# Patient Record
Sex: Female | Born: 1960 | Hispanic: No | Smoking: Never smoker
Health system: Southern US, Community
[De-identification: ages and names within clinical notes are randomized; demographics above are authoritative.]

## PROBLEM LIST (undated history)

## (undated) DIAGNOSIS — I499 Cardiac arrhythmia, unspecified: Secondary | ICD-10-CM

## (undated) DIAGNOSIS — F32A Depression, unspecified: Secondary | ICD-10-CM

## (undated) DIAGNOSIS — J45909 Unspecified asthma, uncomplicated: Secondary | ICD-10-CM

## (undated) DIAGNOSIS — F329 Major depressive disorder, single episode, unspecified: Secondary | ICD-10-CM

## (undated) DIAGNOSIS — E119 Type 2 diabetes mellitus without complications: Secondary | ICD-10-CM

## (undated) DIAGNOSIS — G8929 Other chronic pain: Secondary | ICD-10-CM

## (undated) DIAGNOSIS — I10 Essential (primary) hypertension: Secondary | ICD-10-CM

## (undated) DIAGNOSIS — D573 Sickle-cell trait: Secondary | ICD-10-CM

## (undated) DIAGNOSIS — E7800 Pure hypercholesterolemia, unspecified: Secondary | ICD-10-CM

## (undated) DIAGNOSIS — M797 Fibromyalgia: Secondary | ICD-10-CM

## (undated) DIAGNOSIS — M1990 Unspecified osteoarthritis, unspecified site: Secondary | ICD-10-CM

## (undated) DIAGNOSIS — R519 Headache, unspecified: Secondary | ICD-10-CM

## (undated) DIAGNOSIS — M549 Dorsalgia, unspecified: Secondary | ICD-10-CM

## (undated) DIAGNOSIS — R51 Headache: Secondary | ICD-10-CM

## (undated) DIAGNOSIS — T7840XA Allergy, unspecified, initial encounter: Secondary | ICD-10-CM

## (undated) DIAGNOSIS — K219 Gastro-esophageal reflux disease without esophagitis: Secondary | ICD-10-CM

## (undated) HISTORY — PX: KNEE ARTHROSCOPY: SHX127

## (undated) HISTORY — DX: Unspecified osteoarthritis, unspecified site: M19.90

## (undated) HISTORY — PX: ABDOMINAL HYSTERECTOMY: SHX81

## (undated) HISTORY — PX: EXPLORATORY LAPAROTOMY: SUR591

## (undated) HISTORY — PX: BACK SURGERY: SHX140

---

## 1999-09-09 ENCOUNTER — Other Ambulatory Visit: Admission: RE | Admit: 1999-09-09 | Discharge: 1999-09-09 | Payer: Self-pay | Admitting: Family Medicine

## 2004-06-29 ENCOUNTER — Ambulatory Visit: Payer: Self-pay | Admitting: Family Medicine

## 2004-08-06 ENCOUNTER — Ambulatory Visit: Payer: Self-pay | Admitting: Family Medicine

## 2004-08-11 ENCOUNTER — Ambulatory Visit: Payer: Self-pay | Admitting: Family Medicine

## 2004-09-22 ENCOUNTER — Ambulatory Visit: Payer: Self-pay | Admitting: Family Medicine

## 2005-04-05 ENCOUNTER — Ambulatory Visit: Payer: Self-pay | Admitting: Family Medicine

## 2015-06-16 ENCOUNTER — Other Ambulatory Visit: Payer: Self-pay

## 2015-06-16 MED ORDER — ESOMEPRAZOLE MAGNESIUM 40 MG PO CPDR: 40.0000 mg | DELAYED_RELEASE_CAPSULE | Freq: Every day | ORAL | Status: DC

## 2015-07-15 ENCOUNTER — Other Ambulatory Visit: Payer: Self-pay | Admitting: *Deleted

## 2015-07-15 MED ORDER — MONTELUKAST SODIUM 10 MG PO TABS: 10.0000 mg | ORAL_TABLET | Freq: Every day | ORAL | Status: AC

## 2015-07-15 MED ORDER — RANITIDINE HCL 300 MG PO TABS: 300.0000 mg | ORAL_TABLET | Freq: Every day | ORAL | Status: DC

## 2015-07-15 MED ORDER — LORATADINE 10 MG PO TABS: 10.0000 mg | ORAL_TABLET | Freq: Every day | ORAL | Status: AC | PRN

## 2015-07-26 HISTORY — PX: COLONOSCOPY: SHX174

## 2015-07-30 ENCOUNTER — Other Ambulatory Visit: Payer: Self-pay

## 2015-08-07 ENCOUNTER — Other Ambulatory Visit: Payer: Self-pay | Admitting: *Deleted

## 2015-08-07 ENCOUNTER — Telehealth: Payer: Self-pay | Admitting: *Deleted

## 2015-08-07 MED ORDER — ESOMEPRAZOLE MAGNESIUM 40 MG PO CPDR: DELAYED_RELEASE_CAPSULE | ORAL | Status: AC

## 2015-08-07 NOTE — Telephone Encounter (Signed)
PT CALLED REQUESTING A MEDICATION REFILL FOR NEXIUM. PT HAD PHARMACY SEND US A REFILL REQUEST ON THE 3RD AND STILL HAS NOT HEARD ANYTHING. PHARMACY IS PREVO.

## 2015-08-07 NOTE — Telephone Encounter (Signed)
ONE REFILL SENT - WILL HAVE TO HAVE AN OV BEFORE WE CAN FILL AGAIN.

## 2015-08-17 ENCOUNTER — Other Ambulatory Visit: Payer: Self-pay

## 2015-11-16 ENCOUNTER — Other Ambulatory Visit: Payer: Self-pay | Admitting: Orthopedic Surgery

## 2015-11-16 DIAGNOSIS — M4317 Spondylolisthesis, lumbosacral region: Secondary | ICD-10-CM

## 2015-11-18 ENCOUNTER — Ambulatory Visit
Admission: RE | Admit: 2015-11-18 | Discharge: 2015-11-18 | Disposition: A | Discharge status: Home / Self Care | Payer: Medicare Other | Source: Ambulatory Visit | Attending: Orthopedic Surgery | Admitting: Orthopedic Surgery

## 2015-11-18 DIAGNOSIS — M4317 Spondylolisthesis, lumbosacral region: Secondary | ICD-10-CM

## 2015-12-14 ENCOUNTER — Ambulatory Visit: Payer: Self-pay | Admitting: Physician Assistant

## 2016-02-01 NOTE — Pre-Procedure Instructions (Signed)
Karen KitchensCheryl E Bade  02/01/2016     Your procedure is scheduled on July 19.  [facility] Ad[facility] at 6:30 A.M.  Call this number if you have problems the morning of surgery:  [phone]   Remember:  Do not eat food or drink liquids after midnight.  Take these medicines the morning of surgery with A SIP OF WATER Albuterol (if needed), Nexium, Loratadine (if needed), Singulair, Oxycodone (if needed)   STOP/ Do not take Aspirin, Aleve, Naproxen, Advil, Ibuprofen, Motrin, Vitamins, Herbs, or Supplements starting today    How to Manage Your Diabetes Before and After Surgery  Why is it important to control my blood sugar before and after surgery? . Improving blood sugar levels before and after surgery helps healing and can limit problems. . A way of improving blood sugar control is eating a healthy diet by: o  Eating less sugar and carbohydrates o  Increasing activity/exercise o  Talking with your doctor about reaching your blood sugar goals . High blood sugars (greater than 180 mg/dL) can raise your risk of infections and slow your recovery, so you will need to focus on controlling your diabetes during the weeks before surgery. . Make sure that the doctor who takes care of your diabetes knows about your planned surgery including the date and location.  How do I manage my blood sugar before surgery? . Check your blood sugar at least 4 times a day, starting 2 days before surgery, to make sure that the level is not too high or low. o Check your blood sugar the morning of your surgery when you wake up and every 2 hours until you get to the Short Stay unit. . If your blood sugar is less than 70 mg/dL, you will need to treat for low blood sugar: o Do not take insulin. o Treat a low blood sugar (less than 70 mg/dL) with  cup of clear juice (cranberry or apple), 4 glucose tablets, OR glucose gel. o Recheck blood sugar in 15 minutes after treatment (to make sure it is  greater than 70 mg/dL). If your blood sugar is not greater than 70 mg/dL on recheck, call [phone] for further instructions. . Report your blood sugar to the short stay nurse when you get to Short Stay.  . If you are admitted to the hospital after surgery: o Your blood sugar will be checked by the staff and you will probably be given insulin after surgery (instead of oral diabetes medicines) to make sure you have good blood sugar levels. o The goal for blood sugar control after surgery is 80-180 mg/dL.   WHAT DO I DO ABOUT MY DIABETES MEDICATION?   Marland Kitchen. Do not take oral diabetes medicines (pills) the morning of surgery.  . The day of surgery, do not take other diabetes injectables, including Byetta (exenatide), Bydureon (exenatide ER), Victoza (liraglutide), or Trulicity (dulaglutide).   Do not wear jewelry, make-up or nail polish.  Do not wear lotions, powders, or perfumes.  You may wear deoderant.  Do not shave 48 hours prior to surgery.  Men may shave face and neck.  Do not bring valuables to the hospital.  [facility] is not responsible for any belongings or valuables.  Contacts, dentures or bridgework may not be worn into surgery.  Leave your suitcase in the car.  After surgery it may be brought to your room.  For patients admitted to the hospital, discharge time will be determined by your treatment  team.  Patients discharged the day of surgery will not be allowed to drive home.   [facility] - Preparing for Surgery  Before surgery, you can play an important role.  Because skin is not sterile, your skin needs to be as free of germs as possible.  You can reduce the number of germs on you skin by washing with CHG (chlorahexidine gluconate) soap before surgery.  CHG is an antiseptic cleaner which kills germs and bonds with the skin to continue killing germs even after washing.  Please DO NOT use if you have an allergy to CHG or antibacterial soaps.  If your skin becomes  reddened/irritated stop using the CHG and inform your nurse when you arrive at Short Stay.  Do not shave (including legs and underarms) for at least 48 hours prior to the first CHG shower.  You may shave your face.  Please follow these instructions carefully:   1.  Shower with CHG Soap the night before surgery and the morning of Surgery.  2.  If you choose to wash your hair, wash your hair first as usual with your normal shampoo.  3.  After you shampoo, rinse your hair and body thoroughly to remove the shampoo.  4.  Use CHG as you would any other liquid soap.  You can apply CHG directly to the skin and wash gently with scrungie or a clean washcloth.  5.  Apply the CHG Soap to your body ONLY FROM THE NECK DOWN.  Do not use on open wounds or open sores.  Avoid contact with your eyes, ears, mouth and genitals (private parts).  Wash genitals (private parts) with your normal soap.  6.  Wash thoroughly, paying special attention to the area where your surgery will be performed.  7.  Thoroughly rinse your body with warm water from the neck down.  8.  DO NOT shower/wash with your normal soap after using and rinsing off the CHG Soap.  9.  Pat yourself dry with a clean towel.            10.  Wear clean pajamas.            11.  Place clean sheets on your bed the night of your first shower and do not sleep with pets.  Day of Surgery  Do not apply any lotions the morning of surgery.  Please wear clean clothes to the hospital/surgery center.

## 2016-02-02 ENCOUNTER — Encounter (HOSPITAL_COMMUNITY)
Admission: RE | Admit: 2016-02-02 | Discharge: 2016-02-02 | Disposition: A | Discharge status: Home / Self Care | Payer: Medicare Other | Source: Ambulatory Visit | Attending: Orthopedic Surgery | Admitting: Orthopedic Surgery

## 2016-02-02 ENCOUNTER — Encounter (HOSPITAL_COMMUNITY): Payer: Self-pay

## 2016-02-02 DIAGNOSIS — I10 Essential (primary) hypertension: Secondary | ICD-10-CM | POA: Diagnosis not present

## 2016-02-02 DIAGNOSIS — Z01810 Encounter for preprocedural cardiovascular examination: Secondary | ICD-10-CM | POA: Insufficient documentation

## 2016-02-02 DIAGNOSIS — Z01812 Encounter for preprocedural laboratory examination: Secondary | ICD-10-CM | POA: Diagnosis not present

## 2016-02-02 HISTORY — DX: Headache, unspecified: R51.9

## 2016-02-02 HISTORY — DX: Headache: R51

## 2016-02-02 HISTORY — DX: Pure hypercholesterolemia, unspecified: E78.00

## 2016-02-02 HISTORY — DX: Fibromyalgia: M79.7

## 2016-02-02 HISTORY — DX: Allergy, unspecified, initial encounter: T78.40XA

## 2016-02-02 HISTORY — DX: Unspecified asthma, uncomplicated: J45.909

## 2016-02-02 HISTORY — DX: Other chronic pain: G89.29

## 2016-02-02 HISTORY — DX: Type 2 diabetes mellitus without complications: E11.9

## 2016-02-02 HISTORY — DX: Dorsalgia, unspecified: M54.9

## 2016-02-02 HISTORY — DX: Depression, unspecified: F32.A

## 2016-02-02 HISTORY — DX: Cardiac arrhythmia, unspecified: I49.9

## 2016-02-02 HISTORY — DX: Essential (primary) hypertension: I10

## 2016-02-02 HISTORY — DX: Sickle-cell trait: D57.3

## 2016-02-02 HISTORY — DX: Gastro-esophageal reflux disease without esophagitis: K21.9

## 2016-02-02 HISTORY — DX: Major depressive disorder, single episode, unspecified: F32.9

## 2016-02-02 LAB — BASIC METABOLIC PANEL
Anion gap: 9 (ref 5–15)
BUN: 12 mg/dL (ref 6–20)
CO2: 28 mmol/L (ref 22–32)
Calcium: 9.4 mg/dL (ref 8.9–10.3)
Chloride: 101 mmol/L (ref 101–111)
Creatinine, Ser: 0.96 mg/dL (ref 0.44–1.00)
GFR calc Af Amer: >60 mL/min (ref >60)
GFR calc non Af Amer: >60 mL/min (ref >60)
Glucose, Bld: 104 mg/dL — ABNORMAL HIGH (ref 65–99)
Potassium: 3.4 mmol/L — ABNORMAL LOW (ref 3.5–5.1)
Sodium: 138 mmol/L (ref 135–145)

## 2016-02-02 LAB — ABO/RH: ABO/RH(D): O POS

## 2016-02-02 LAB — CBC
HCT: 39.4 % (ref 36.0–46.0)
Hemoglobin: 13.1 g/dL (ref 12.0–15.0)
MCH: 26.3 pg (ref 26.0–34.0)
MCHC: 33.2 g/dL (ref 30.0–36.0)
MCV: 79.1 fL (ref 78.0–100.0)
Platelets: 232 10*3/uL (ref 150–400)
RBC: 4.98 MIL/uL (ref 3.87–5.11)
RDW: 13.9 % (ref 11.5–15.5)
WBC: 5.5 10*3/uL (ref 4.0–10.5)

## 2016-02-02 LAB — TYPE AND SCREEN
ABO/RH(D): O POS
Antibody Screen: NEGATIVE

## 2016-02-02 LAB — SURGICAL PCR SCREEN
MRSA, PCR: NEGATIVE
Staphylococcus aureus: NEGATIVE

## 2016-02-02 LAB — GLUCOSE, CAPILLARY: Glucose-Capillary: 97 mg/dL (ref 65–99)

## 2016-02-02 NOTE — Progress Notes (Addendum)
PCP - Dina Richobert Dough - requested records - [phone] Cardiologist - Dr. Gypsy BalsamKrasowski, robert - requested records [phone] Allergy Doctor - Dr. Sharyn LullKoslow [phone]  Chest x-ray - denies EKG - several years ago - requested - completed one today 02/02/16 Stress Test - several years ago - requested ECHO - several years ago - requested Cardiac Cath - denies  Patient had sleep study completed several years ago, was not diagnosed with Sleep apnea - requested records [phone]  Patient has several allergies     Patient denies shortness of breath, fever, cough and chest pain at PAT appointment   Will need A1C DOS

## 2016-02-03 LAB — HEMOGLOBIN A1C
Hgb A1c MFr Bld: 5.8 % — ABNORMAL HIGH (ref 4.8–5.6)
Mean Plasma Glucose: 120 mg/dL

## 2016-02-03 NOTE — Progress Notes (Signed)
Anesthesia Chart Review:  Pt is a 55 year old female scheduled for L5-S1 TLIF on 02/10/2016 with Venita Lickahari Brooks, MD.   PMH includes:  HTN, DM, hyperlipidemia, asthma, GERD. BMI 41  Pt will multiple allergies.   Medications include: albuterol, lipitor, nexium, irbesartan-hctz, zantac, victoza, welchol  Preoperative labs reviewed.  HgbA1c 5.8, glucose 104  EKG 02/02/16: NSR. Minimal voltage criteria for LVH, may be normal variant  Cardiac event monitor 05/23/11:  1. Normal event monitor (worn 9/20-10/18/12) 2. No arrhythmias noted 3. No symptoms reported  Nuclear stress test 04/20/11:  - no evidence ischemia - normal wall motion and thickening. EF 60% - Good exercise capacity  Echo 02/27/06:  1. Very mild MR with mild LA enlargement 2. LV size, wall thickness and systolic function are normal.   Pt saw Gypsy Balsamobert Krasowski, MD with cardiology in 2012 for palpitations. His notes indicate pt wore a Holter monitor in 2009 that showed 1 episode of SVT and pt was placed on digoxin. Dr. Bing MatterKrasowski ordered a cardiac event monitor and stress test (see above). It appears that pt was taken off of digoxin with normal test results and no longer follows with cardiology.   If no changes, I anticipate pt can proceed with surgery as scheduled.   Rica Mastngela Maurisio Ruddy, FNP-BC [facility]/Anesthesiology Phone: [phone] 02/03/2016 12:25 PM

## 2016-02-10 ENCOUNTER — Inpatient Hospital Stay (HOSPITAL_COMMUNITY)
Admission: RE | Admit: 2016-02-10 | Discharge: 2016-02-12 | DRG: 460 | Disposition: A | Discharge status: Home / Self Care | Payer: Medicare Other | Source: Ambulatory Visit | Attending: Orthopedic Surgery | Admitting: Orthopedic Surgery

## 2016-02-10 ENCOUNTER — Encounter (HOSPITAL_COMMUNITY)
Admission: RE | Disposition: A | Discharge status: Home / Self Care | Payer: Self-pay | Source: Ambulatory Visit | Attending: Orthopedic Surgery

## 2016-02-10 ENCOUNTER — Inpatient Hospital Stay (HOSPITAL_COMMUNITY): Payer: Medicare Other | Admitting: Emergency Medicine

## 2016-02-10 ENCOUNTER — Inpatient Hospital Stay (HOSPITAL_COMMUNITY): Payer: Medicare Other | Admitting: Certified Registered"

## 2016-02-10 ENCOUNTER — Inpatient Hospital Stay (HOSPITAL_COMMUNITY): Payer: Medicare Other

## 2016-02-10 ENCOUNTER — Encounter (HOSPITAL_COMMUNITY): Payer: Self-pay | Admitting: *Deleted

## 2016-02-10 DIAGNOSIS — E119 Type 2 diabetes mellitus without complications: Secondary | ICD-10-CM | POA: Diagnosis present

## 2016-02-10 DIAGNOSIS — M797 Fibromyalgia: Secondary | ICD-10-CM | POA: Diagnosis present

## 2016-02-10 DIAGNOSIS — M5116 Intervertebral disc disorders with radiculopathy, lumbar region: Principal | ICD-10-CM | POA: Diagnosis present

## 2016-02-10 DIAGNOSIS — Z419 Encounter for procedure for purposes other than remedying health state, unspecified: Secondary | ICD-10-CM

## 2016-02-10 DIAGNOSIS — K219 Gastro-esophageal reflux disease without esophagitis: Secondary | ICD-10-CM | POA: Diagnosis present

## 2016-02-10 DIAGNOSIS — I10 Essential (primary) hypertension: Secondary | ICD-10-CM | POA: Diagnosis present

## 2016-02-10 DIAGNOSIS — J45909 Unspecified asthma, uncomplicated: Secondary | ICD-10-CM | POA: Diagnosis present

## 2016-02-10 DIAGNOSIS — F329 Major depressive disorder, single episode, unspecified: Secondary | ICD-10-CM | POA: Diagnosis present

## 2016-02-10 DIAGNOSIS — Z7984 Long term (current) use of oral hypoglycemic drugs: Secondary | ICD-10-CM

## 2016-02-10 DIAGNOSIS — M4806 Spinal stenosis, lumbar region: Secondary | ICD-10-CM | POA: Diagnosis present

## 2016-02-10 DIAGNOSIS — E7800 Pure hypercholesterolemia, unspecified: Secondary | ICD-10-CM | POA: Diagnosis present

## 2016-02-10 DIAGNOSIS — Z833 Family history of diabetes mellitus: Secondary | ICD-10-CM | POA: Diagnosis not present

## 2016-02-10 DIAGNOSIS — Z6839 Body mass index (BMI) 39.0-39.9, adult: Secondary | ICD-10-CM | POA: Diagnosis not present

## 2016-02-10 DIAGNOSIS — M4306 Spondylolysis, lumbar region: Secondary | ICD-10-CM | POA: Diagnosis present

## 2016-02-10 DIAGNOSIS — Z818 Family history of other mental and behavioral disorders: Secondary | ICD-10-CM | POA: Diagnosis not present

## 2016-02-10 DIAGNOSIS — M549 Dorsalgia, unspecified: Secondary | ICD-10-CM | POA: Diagnosis present

## 2016-02-10 DIAGNOSIS — Z981 Arthrodesis status: Secondary | ICD-10-CM

## 2016-02-10 LAB — GLUCOSE, CAPILLARY
Glucose-Capillary: 112 mg/dL — ABNORMAL HIGH (ref 65–99)
Glucose-Capillary: 116 mg/dL — ABNORMAL HIGH (ref 65–99)
Glucose-Capillary: 151 mg/dL — ABNORMAL HIGH (ref 65–99)
Glucose-Capillary: 205 mg/dL — ABNORMAL HIGH (ref 65–99)

## 2016-02-10 SURGERY — POSTERIOR LUMBAR FUSION 1 LEVEL
Anesthesia: General | Site: Back

## 2016-02-10 MED ORDER — ARTIFICIAL TEARS OP OINT
TOPICAL_OINTMENT | OPHTHALMIC | Status: AC
  Filled 2016-02-10: qty 3.5

## 2016-02-10 MED ORDER — MORPHINE SULFATE (PF) 2 MG/ML IV SOLN
1.0000 mg | INTRAVENOUS | Status: DC | PRN
  Administered 2016-02-10 (×2): 2 mg via INTRAVENOUS
  Filled 2016-02-10 (×2): qty 1

## 2016-02-10 MED ORDER — SODIUM CHLORIDE 0.9% FLUSH
3.0000 mL | Freq: Two times a day (BID) | INTRAVENOUS | Status: DC
  Administered 2016-02-10 (×2): 3 mL via INTRAVENOUS

## 2016-02-10 MED ORDER — METHOCARBAMOL 500 MG PO TABS
500.0000 mg | ORAL_TABLET | Freq: Four times a day (QID) | ORAL | Status: DC | PRN
  Administered 2016-02-10 – 2016-02-12 (×7): 500 mg via ORAL
  Filled 2016-02-10 (×6): qty 1

## 2016-02-10 MED ORDER — DEXAMETHASONE SODIUM PHOSPHATE 10 MG/ML IJ SOLN
INTRAMUSCULAR | Status: AC
  Filled 2016-02-10: qty 1

## 2016-02-10 MED ORDER — PHENYLEPHRINE HCL 10 MG/ML IJ SOLN
INTRAMUSCULAR | Status: DC | PRN
  Administered 2016-02-10: 40 ug via INTRAVENOUS
  Administered 2016-02-10 (×4): 80 ug via INTRAVENOUS

## 2016-02-10 MED ORDER — ALBUTEROL SULFATE (2.5 MG/3ML) 0.083% IN NEBU: 3.0000 mL | INHALATION_SOLUTION | RESPIRATORY_TRACT | Status: DC | PRN

## 2016-02-10 MED ORDER — OXYCODONE HCL 5 MG/5ML PO SOLN: 5.0000 mg | Freq: Once | ORAL | Status: AC | PRN

## 2016-02-10 MED ORDER — LIDOCAINE HCL (CARDIAC) 20 MG/ML IV SOLN
INTRAVENOUS | Status: DC | PRN
  Administered 2016-02-10: 100 mg via INTRAVENOUS

## 2016-02-10 MED ORDER — DEXAMETHASONE SODIUM PHOSPHATE 10 MG/ML IJ SOLN
INTRAMUSCULAR | Status: DC | PRN
  Administered 2016-02-10: 10 mg via INTRAVENOUS

## 2016-02-10 MED ORDER — OXYCODONE HCL 5 MG PO TABS
5.0000 mg | ORAL_TABLET | Freq: Once | ORAL | Status: AC | PRN
  Administered 2016-02-10: 5 mg via ORAL

## 2016-02-10 MED ORDER — MENTHOL 3 MG MT LOZG
1.0000 | LOZENGE | OROMUCOSAL | Status: DC | PRN
Start: 2016-02-10 — End: 2016-02-12

## 2016-02-10 MED ORDER — PROPOFOL 10 MG/ML IV BOLUS
INTRAVENOUS | Status: DC | PRN
  Administered 2016-02-10: 200 mg via INTRAVENOUS

## 2016-02-10 MED ORDER — SODIUM CHLORIDE 0.9% FLUSH: 3.0000 mL | INTRAVENOUS | Status: DC | PRN

## 2016-02-10 MED ORDER — OXYCODONE HCL 5 MG PO TABS
10.0000 mg | ORAL_TABLET | ORAL | Status: DC | PRN
  Administered 2016-02-10 – 2016-02-12 (×11): 10 mg via ORAL
  Filled 2016-02-10 (×10): qty 2

## 2016-02-10 MED ORDER — ACETAMINOPHEN 10 MG/ML IV SOLN
1000.0000 mg | Freq: Once | INTRAVENOUS | Status: AC
  Administered 2016-02-10: 1000 mg via INTRAVENOUS
  Filled 2016-02-10: qty 100

## 2016-02-10 MED ORDER — LACTATED RINGERS IV SOLN: INTRAVENOUS | Status: DC

## 2016-02-10 MED ORDER — LIDOCAINE 2% (20 MG/ML) 5 ML SYRINGE
INTRAMUSCULAR | Status: AC
  Filled 2016-02-10: qty 5

## 2016-02-10 MED ORDER — MIDAZOLAM HCL 5 MG/5ML IJ SOLN
INTRAMUSCULAR | Status: DC | PRN
  Administered 2016-02-10: 2 mg via INTRAVENOUS

## 2016-02-10 MED ORDER — CEFAZOLIN SODIUM-DEXTROSE 2-4 GM/100ML-% IV SOLN
2.0000 g | INTRAVENOUS | Status: AC
  Administered 2016-02-10: 2 g via INTRAVENOUS
  Filled 2016-02-10: qty 100

## 2016-02-10 MED ORDER — LACTATED RINGERS IV SOLN
INTRAVENOUS | Status: DC | PRN
Start: 2016-02-10 — End: 2016-02-10
  Administered 2016-02-10: 09:00:00 via INTRAVENOUS

## 2016-02-10 MED ORDER — METHOCARBAMOL 1000 MG/10ML IJ SOLN
500.0000 mg | Freq: Four times a day (QID) | INTRAVENOUS | Status: DC | PRN
  Filled 2016-02-10: qty 5

## 2016-02-10 MED ORDER — EPINEPHRINE 0.3 MG/0.3ML IJ SOAJ: 0.3000 mg | Freq: Once | INTRAMUSCULAR | Status: DC

## 2016-02-10 MED ORDER — IRBESARTAN-HYDROCHLOROTHIAZIDE 300-12.5 MG PO TABS: 1.0000 | ORAL_TABLET | Freq: Every day | ORAL | Status: DC

## 2016-02-10 MED ORDER — SUCCINYLCHOLINE CHLORIDE 20 MG/ML IJ SOLN
INTRAMUSCULAR | Status: DC | PRN
  Administered 2016-02-10: 80 mg via INTRAVENOUS

## 2016-02-10 MED ORDER — THROMBIN 20000 UNITS EX SOLR
CUTANEOUS | Status: DC | PRN
  Administered 2016-02-10: 20000 [IU] via TOPICAL

## 2016-02-10 MED ORDER — CEFAZOLIN IN D5W 1 GM/50ML IV SOLN
1.0000 g | Freq: Three times a day (TID) | INTRAVENOUS | Status: AC
  Administered 2016-02-10 (×2): 1 g via INTRAVENOUS
  Filled 2016-02-10 (×2): qty 50

## 2016-02-10 MED ORDER — ARTIFICIAL TEARS OP OINT
TOPICAL_OINTMENT | OPHTHALMIC | Status: DC | PRN
  Administered 2016-02-10: 1 via OPHTHALMIC

## 2016-02-10 MED ORDER — METHOCARBAMOL 500 MG PO TABS: 500.0000 mg | ORAL_TABLET | Freq: Three times a day (TID) | ORAL | Status: DC | PRN

## 2016-02-10 MED ORDER — HYDROMORPHONE HCL 1 MG/ML IJ SOLN: 0.2500 mg | INTRAMUSCULAR | Status: DC | PRN

## 2016-02-10 MED ORDER — FENTANYL CITRATE (PF) 250 MCG/5ML IJ SOLN
INTRAMUSCULAR | Status: AC
  Filled 2016-02-10: qty 5

## 2016-02-10 MED ORDER — DEXAMETHASONE SODIUM PHOSPHATE 4 MG/ML IJ SOLN: 4.0000 mg | Freq: Four times a day (QID) | INTRAMUSCULAR | Status: AC

## 2016-02-10 MED ORDER — LUBIPROSTONE 24 MCG PO CAPS
24.0000 ug | ORAL_CAPSULE | Freq: Two times a day (BID) | ORAL | Status: DC
  Administered 2016-02-10 – 2016-02-12 (×4): 24 ug via ORAL
  Filled 2016-02-10 (×5): qty 1

## 2016-02-10 MED ORDER — SURGIFOAM 100 EX MISC
CUTANEOUS | Status: DC | PRN
  Administered 2016-02-10: 1 via TOPICAL

## 2016-02-10 MED ORDER — PROPOFOL 10 MG/ML IV BOLUS
INTRAVENOUS | Status: AC
  Filled 2016-02-10: qty 20

## 2016-02-10 MED ORDER — ONDANSETRON HCL 4 MG PO TABS: 4.0000 mg | ORAL_TABLET | Freq: Three times a day (TID) | ORAL | Status: DC | PRN

## 2016-02-10 MED ORDER — PHENOL 1.4 % MT LIQD
1.0000 | OROMUCOSAL | Status: DC | PRN
  Filled 2016-02-10: qty 177

## 2016-02-10 MED ORDER — METHOCARBAMOL 500 MG PO TABS
ORAL_TABLET | ORAL | Status: AC
  Filled 2016-02-10: qty 1

## 2016-02-10 MED ORDER — HYDROCHLOROTHIAZIDE 12.5 MG PO CAPS
12.5000 mg | ORAL_CAPSULE | Freq: Every day | ORAL | Status: DC
  Administered 2016-02-11 – 2016-02-12 (×2): 12.5 mg via ORAL
  Filled 2016-02-10 (×2): qty 1

## 2016-02-10 MED ORDER — SERTRALINE HCL 25 MG PO TABS
25.0000 mg | ORAL_TABLET | Freq: Every day | ORAL | Status: DC
Start: 2016-02-10 — End: 2016-02-12
  Administered 2016-02-10 – 2016-02-12 (×3): 25 mg via ORAL
  Filled 2016-02-10 (×3): qty 1

## 2016-02-10 MED ORDER — KETAMINE HCL 100 MG/ML IJ SOLN
INTRAMUSCULAR | Status: AC
  Filled 2016-02-10: qty 1

## 2016-02-10 MED ORDER — BUPIVACAINE-EPINEPHRINE 0.25% -1:200000 IJ SOLN
INTRAMUSCULAR | Status: DC | PRN
  Administered 2016-02-10: 20 mL

## 2016-02-10 MED ORDER — LACTATED RINGERS IV SOLN
INTRAVENOUS | Status: DC | PRN
Start: 2016-02-10 — End: 2016-02-10
  Administered 2016-02-10 (×2): via INTRAVENOUS

## 2016-02-10 MED ORDER — MIDAZOLAM HCL 2 MG/2ML IJ SOLN
INTRAMUSCULAR | Status: AC
  Filled 2016-02-10: qty 2

## 2016-02-10 MED ORDER — 0.9 % SODIUM CHLORIDE (POUR BTL) OPTIME
TOPICAL | Status: DC | PRN
  Administered 2016-02-10: 1000 mL

## 2016-02-10 MED ORDER — MAGNESIUM CITRATE PO SOLN
1.0000 | Freq: Every day | ORAL | Status: DC | PRN
  Administered 2016-02-11: 1 via ORAL
  Filled 2016-02-10: qty 296

## 2016-02-10 MED ORDER — HEMOSTATIC AGENTS (NO CHARGE) OPTIME
TOPICAL | Status: DC | PRN
  Administered 2016-02-10: 1 via TOPICAL

## 2016-02-10 MED ORDER — DEXAMETHASONE 4 MG PO TABS
4.0000 mg | ORAL_TABLET | Freq: Four times a day (QID) | ORAL | Status: AC
  Administered 2016-02-10 (×3): 4 mg via ORAL
  Filled 2016-02-10 (×3): qty 1

## 2016-02-10 MED ORDER — THROMBIN 20000 UNITS EX SOLR
CUTANEOUS | Status: AC
  Filled 2016-02-10: qty 20000

## 2016-02-10 MED ORDER — FENTANYL CITRATE (PF) 100 MCG/2ML IJ SOLN
INTRAMUSCULAR | Status: DC | PRN
  Administered 2016-02-10 (×3): 50 ug via INTRAVENOUS

## 2016-02-10 MED ORDER — OXYCODONE-ACETAMINOPHEN 10-325 MG PO TABS: 1.0000 | ORAL_TABLET | ORAL | Status: DC | PRN

## 2016-02-10 MED ORDER — IRBESARTAN 300 MG PO TABS
300.0000 mg | ORAL_TABLET | Freq: Every day | ORAL | Status: DC
  Administered 2016-02-11 – 2016-02-12 (×2): 300 mg via ORAL
  Filled 2016-02-10 (×3): qty 1

## 2016-02-10 MED ORDER — MAGNESIUM CITRATE PO SOLN: 1.0000 | ORAL | Status: DC | PRN

## 2016-02-10 MED ORDER — COLESEVELAM HCL 625 MG PO TABS
975.0000 mg | ORAL_TABLET | Freq: Two times a day (BID) | ORAL | Status: DC
  Administered 2016-02-10 – 2016-02-12 (×4): 937.5 mg via ORAL
  Filled 2016-02-10 (×5): qty 1.5

## 2016-02-10 MED ORDER — LIRAGLUTIDE 18 MG/3ML ~~LOC~~ SOPN: 1.8000 mg | PEN_INJECTOR | Freq: Every day | SUBCUTANEOUS | Status: DC

## 2016-02-10 MED ORDER — BUPIVACAINE-EPINEPHRINE (PF) 0.25% -1:200000 IJ SOLN
INTRAMUSCULAR | Status: AC
  Filled 2016-02-10: qty 30

## 2016-02-10 MED ORDER — ONDANSETRON HCL 4 MG/2ML IJ SOLN
INTRAMUSCULAR | Status: AC
  Filled 2016-02-10: qty 2

## 2016-02-10 MED ORDER — EPHEDRINE SULFATE 50 MG/ML IJ SOLN
INTRAMUSCULAR | Status: DC | PRN
  Administered 2016-02-10 (×4): 5 mg via INTRAVENOUS

## 2016-02-10 MED ORDER — OXYCODONE HCL 5 MG PO TABS
ORAL_TABLET | ORAL | Status: AC
  Filled 2016-02-10: qty 3

## 2016-02-10 MED ORDER — ONDANSETRON HCL 4 MG/2ML IJ SOLN
INTRAMUSCULAR | Status: DC | PRN
  Administered 2016-02-10 (×2): 4 mg via INTRAVENOUS

## 2016-02-10 MED ORDER — SODIUM CHLORIDE 0.9 % IV SOLN
0.0500 ug/kg/min | INTRAVENOUS | Status: AC
  Administered 2016-02-10: 0.1 ug/kg/min via INTRAVENOUS
  Filled 2016-02-10: qty 5000

## 2016-02-10 MED ORDER — PROPOFOL 1000 MG/100ML IV EMUL
INTRAVENOUS | Status: AC
  Filled 2016-02-10: qty 200

## 2016-02-10 MED ORDER — KETAMINE HCL 100 MG/ML IJ SOLN
INTRAMUSCULAR | Status: DC | PRN
Start: 2016-02-10 — End: 2016-02-10
  Administered 2016-02-10 (×2): 10 mg via INTRAVENOUS
  Administered 2016-02-10: 30 mg via INTRAVENOUS

## 2016-02-10 MED ORDER — ONDANSETRON HCL 4 MG/2ML IJ SOLN: 4.0000 mg | INTRAMUSCULAR | Status: DC | PRN

## 2016-02-10 MED ORDER — PHENYLEPHRINE 40 MCG/ML (10ML) SYRINGE FOR IV PUSH (FOR BLOOD PRESSURE SUPPORT)
PREFILLED_SYRINGE | INTRAVENOUS | Status: AC
  Filled 2016-02-10: qty 10

## 2016-02-10 SURGICAL SUPPLY — 81 items
BLADE SURG ROTATE 9660 (MISCELLANEOUS) IMPLANT
BUR EGG ELITE 4.0 (BURR) IMPLANT
BUR EGG ELITE 4.0MM (BURR)
CAGE TLIF NANOLOCK 11 (Cage) ×2 IMPLANT
CAGE TLIF NANOLOCK 11MM (Cage) ×1 IMPLANT
CANISTER SUCT 3000ML (MISCELLANEOUS) ×3 IMPLANT
CLIP NEUROVISION LG (CLIP) ×3 IMPLANT
CLOSURE STERI-STRIP 1/2X4 (GAUZE/BANDAGES/DRESSINGS) ×1
CLSR STERI-STRIP ANTIMIC 1/2X4 (GAUZE/BANDAGES/DRESSINGS) ×2 IMPLANT
COVER SURGICAL LIGHT HANDLE (MISCELLANEOUS) ×3 IMPLANT
DRAPE C-ARM 42X72 X-RAY (DRAPES) ×3 IMPLANT
DRAPE C-ARMOR (DRAPES) ×3 IMPLANT
DRAPE POUCH INSTRU U-SHP 10X18 (DRAPES) ×3 IMPLANT
DRAPE SURG 17X23 STRL (DRAPES) ×3 IMPLANT
DRAPE U-SHAPE 47X51 STRL (DRAPES) ×3 IMPLANT
DRSG AQUACEL AG ADV 3.5X10 (GAUZE/BANDAGES/DRESSINGS) ×3 IMPLANT
DRSG MEPILEX BORDER 4X8 (GAUZE/BANDAGES/DRESSINGS) ×3 IMPLANT
DURAPREP 26ML APPLICATOR (WOUND CARE) ×3 IMPLANT
ELECT BLADE 4.0 EZ CLEAN MEGAD (MISCELLANEOUS) ×3
ELECT BLADE 6.5 EXT (BLADE) IMPLANT
ELECT PENCIL ROCKER SW 15FT (MISCELLANEOUS) ×3 IMPLANT
ELECT REM PT RETURN 9FT ADLT (ELECTROSURGICAL) ×3
ELECTRODE BLDE 4.0 EZ CLN MEGD (MISCELLANEOUS) ×1 IMPLANT
ELECTRODE REM PT RTRN 9FT ADLT (ELECTROSURGICAL) ×1 IMPLANT
GLOVE BIOGEL PI IND STRL 8.5 (GLOVE) ×1 IMPLANT
GLOVE BIOGEL PI INDICATOR 8.5 (GLOVE) ×2
GLOVE SS BIOGEL STRL SZ 8.5 (GLOVE) ×1 IMPLANT
GLOVE SUPERSENSE BIOGEL SZ 8.5 (GLOVE) ×2
GOWN STRL REUS W/ TWL LRG LVL3 (GOWN DISPOSABLE) ×1 IMPLANT
GOWN STRL REUS W/TWL 2XL LVL3 (GOWN DISPOSABLE) ×6 IMPLANT
GOWN STRL REUS W/TWL LRG LVL3 (GOWN DISPOSABLE) ×2
GUIDEWIRE NITINOL BEVEL TIP (WIRE) ×12 IMPLANT
KIT BASIN OR (CUSTOM PROCEDURE TRAY) ×3 IMPLANT
KIT POSITION SURG JACKSON T1 (MISCELLANEOUS) IMPLANT
KIT ROOM TURNOVER OR (KITS) ×3 IMPLANT
MODULE NVM5 NEXT GEN EMG (NEEDLE) ×3 IMPLANT
NEEDLE 22X1 1/2 (OR ONLY) (NEEDLE) ×3 IMPLANT
NEEDLE I-PASS III (NEEDLE) ×3 IMPLANT
NEEDLE SPNL 18GX3.5 QUINCKE PK (NEEDLE) ×6 IMPLANT
NS IRRIG 1000ML POUR BTL (IV SOLUTION) ×3 IMPLANT
PACK LAMINECTOMY ORTHO (CUSTOM PROCEDURE TRAY) ×3 IMPLANT
PACK UNIVERSAL I (CUSTOM PROCEDURE TRAY) ×3 IMPLANT
PAD ARMBOARD 7.5X6 YLW CONV (MISCELLANEOUS) ×6 IMPLANT
PATTIES SURGICAL .5 X.5 (GAUZE/BANDAGES/DRESSINGS) ×3 IMPLANT
PATTIES SURGICAL .5 X1 (DISPOSABLE) ×3 IMPLANT
POSITIONER HEAD PRONE TRACH (MISCELLANEOUS) ×3 IMPLANT
PROBE BALL TIP NVM5 SNG USE (BALLOONS) ×3 IMPLANT
PUTTY DBX 1CC (Putty) ×3 IMPLANT
PUTTY DBX 1CC DEPUY (Putty) ×1 IMPLANT
REDUCTION EXT RELINE MAS MOD (Neuro Prosthesis/Implant) ×6 IMPLANT
ROD RELINE MAS LORD 5.5X45MM (Rod) ×3 IMPLANT
ROD RELINE MAS TI LORD 5.5X40 (Rod) ×3 IMPLANT
SCREW LOCK RELINE 5.5 TULIP (Screw) ×12 IMPLANT
SCREW RELINE MAS POLY 6.5X40MM (Screw) ×3 IMPLANT
SCREW RELINE RED 6.5X45MM POLY (Screw) ×3 IMPLANT
SCREW SHANK MAS MOD 6.5X40MM (Screw) ×3 IMPLANT
SCREW SHANK RELINE 6.5X45MM 2C (Screw) ×3 IMPLANT
SHEET CONFORM 45LX20WX5H (Bone Implant) ×3 IMPLANT
SPONGE LAP 4X18 X RAY DECT (DISPOSABLE) ×6 IMPLANT
SPONGE SURGIFOAM ABS GEL 100 (HEMOSTASIS) ×3 IMPLANT
SURGIFLO W/THROMBIN 8M KIT (HEMOSTASIS) IMPLANT
SUT BONE WAX W31G (SUTURE) ×3 IMPLANT
SUT MNCRL AB 3-0 PS2 18 (SUTURE) ×9 IMPLANT
SUT MON AB 2-0 CT1 36 (SUTURE) ×3 IMPLANT
SUT MON AB 3-0 SH 27 (SUTURE) ×2
SUT MON AB 3-0 SH27 (SUTURE) ×1 IMPLANT
SUT STRATAFIX 1PDS 45CM VIOLET (SUTURE) ×3 IMPLANT
SUT VIC AB 1 CT1 18XCR BRD 8 (SUTURE) ×1 IMPLANT
SUT VIC AB 1 CT1 8-18 (SUTURE) ×2
SUT VIC AB 2-0 CT1 18 (SUTURE) ×3 IMPLANT
SUT VIC AB 2-0 CT1 27 (SUTURE) ×4
SUT VIC AB 2-0 CT1 TAPERPNT 27 (SUTURE) ×2 IMPLANT
SUT VICRYL 0 UR6 27IN ABS (SUTURE) ×3 IMPLANT
SYR BULB IRRIGATION 50ML (SYRINGE) ×3 IMPLANT
SYR CONTROL 10ML LL (SYRINGE) ×3 IMPLANT
TAPE STRIPS DRAPE STRL (GAUZE/BANDAGES/DRESSINGS) ×3 IMPLANT
TOWEL OR 17X24 6PK STRL BLUE (TOWEL DISPOSABLE) ×3 IMPLANT
TOWEL OR 17X26 10 PK STRL BLUE (TOWEL DISPOSABLE) ×3 IMPLANT
TRAY FOLEY CATH 16FRSI W/METER (SET/KITS/TRAYS/PACK) ×3 IMPLANT
WATER STERILE IRR 1000ML POUR (IV SOLUTION) ×3 IMPLANT
YANKAUER SUCT BULB TIP NO VENT (SUCTIONS) ×3 IMPLANT

## 2016-02-10 NOTE — Transfer of Care (Signed)
Immediate Anesthesia Transfer of Care Note  Patient: [name] [name]  Procedure(s) Performed: Procedure(s): TLIF L5-S1 (N/A)  Patient Location: PACU  Anesthesia Type:General  Level of Consciousness:  sedated, patient cooperative and responds to stimulation  Airway & Oxygen Therapy:Patient Spontanous Breathing and Patient connected to face mask oxgen  Post-op Assessment:  Report given to PACU RN and Post -op Vital signs reviewed and stable  Post vital signs:  Reviewed and stable  Last Vitals:  Filed Vitals:   02/10/16 0738  BP: 99/70  Pulse: 69  Temp: 36.7 C  Resp: 18    Complications: No apparent anesthesia complications

## 2016-02-10 NOTE — H&P (Signed)
History of Present Illness  The patient is a 55 year old female who comes in today for a preoperative History and Physical. The patient is scheduled for a TLIF L5-S1 to be performed by Dr. Debria Garretahari D. Shon BatonBrooks, MD [facility] 02-10-16 . Please see the hospital record for complete dictated history and physical. the pt carries a diagnosis of Dm. Her last A1c was 6.3.   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 Diabetes Mellitus  Father, Mother. Heart Disease  Father. Hypertension  Father, Mother. Kidney disease  Mother.  Social History  Tobacco use  Never smoker. 11/30/2015 Children  5 or more Current work status  disabled Exercise  Exercises rarely Former drinker  11/30/2015: In the past drank beer only occasionally per week Living situation  live with spouse Marital status  married No history of drug/alcohol rehab  Number of flights of stairs before winded  2-3 Under pain contract   Medication History  Flexeril (10MG  Tablet, Oral) Active. (prn Rx'd at Pain Management) Atorvastatin Calcium (40MG  Tablet, Oral) Active. Welchol (625MG  Tablet, Oral) Active. Victoza (18MG /3ML Soln Pen-inj, Subcutaneous) Active. Sertraline HCl (25MG  Tablet, Oral) Active. RaNITidine HCl (300MG  Tablet, Oral) Active. Loratadine (10MG  Tablet, Oral) Active. Irbesartan-Hydrochlorothiazide (300-12.5MG  Tablet, Oral) Active. Esomeprazole Magnesium (40MG  Capsule DR, Oral) Active. OxyCODONE HCl (15MG  Tablet, Oral) Active. ("#2 every 3 hrs"  Rx'd at [facility] Management) Montelukast Sodium (10MG  Tablet, Oral) Active. Medications Reconciled  Past Surgical History  Dilation and Curettage of Uterus  Hysterectomy  partial (non-cancerous)  Other Problems  Asthma  Chronic Pain  Depression  Diabetes Mellitus, Type II  Fibromyalgia  Gastroesophageal Reflux Disease  Gout  High blood pressure  Hypercholesterolemia  Migraine Headache  Rheumatoid Arthritis   Vitals 02/02/2016 9:02 AM Weight: 232 lb Height: 64in Body Surface Area: 2.08 m Body Mass Index: 39.82 kg/m  Temp.: 98.68F(Oral)  Pulse: 74 (Regular)  BP: 129/78 (Sitting, Left Arm, Standard)  General General Appearance-Not in acute distress. Orientation-Oriented X3. Build & Nutrition-Well nourished and Well developed.  Integumentary General Characteristics Surgical Scars - no surgical scar evidence of previous lumbar surgery. Lumbar Spine-Skin examination of the lumbar spine is without deformity, skin lesions, lacerations or abrasions.  Chest and Lung Exam Auscultation Breath sounds - Normal and Clear.  Cardiovascular Auscultation Rhythm - Regular rate and rhythm.  Abdomen Palpation/Percussion Palpation and Percussion of the abdomen reveal - Soft, Non Tender and No Rebound tenderness.  Peripheral Vascular Lower Extremity Palpation - Posterior tibial pulse - Bilateral - 2+. Dorsalis pedis pulse - Bilateral - 2+.  Neurologic Sensation Lower Extremity - Left - sensation is diminished in the lower extremity. Right - sensation is intact in the lower extremity. Reflexes Patellar Reflex - Bilateral - 2+. Achilles Reflex - Bilateral - 2+. Clonus - Bilateral - clonus not present. Hoffman's Sign - Bilateral - Hoffman's sign not present. Testing Seated Straight Leg Raise - Bilateral - Seated straight leg raise negative.  Musculoskeletal Spine/Ribs/Pelvis  Lumbosacral Spine: Inspection and Palpation - Tenderness - left lumbar  paraspinals tender to palpation and right lumbar paraspinals tender to palpation. Strength and Tone: Strength - Hip Flexion - Bilateral -  5/5. Knee Extension - Bilateral - 5/5. Knee Flexion - Bilateral - 5/5. Ankle Dorsiflexion - Left - 4-/5. Right - 5/5. Ankle Plantarflexion - Left - 4/5. Right - 5/5. Heel walk - Bilateral - able to heel walk with moderate difficulty. Toe Walk - Bilateral - able to walk on toes with moderate difficulty. Heel-Toe Walk - Bilateral - able to heel-toe walk with mild difficulty. ROM - Flexion - moderately decreased range of motion and painful. Extension - moderately decreased range of motion and painful. Left Lateral Bending - moderately decreased range of motion and painful. Right Lateral Bending - moderately decreased range of motion and painful. Right Rotation - moderately decreased range of motion and painful. Left Rotation - moderately decreased range of motion and painful. Pain - . Lumbosacral Spine - Waddell's Signs - no Waddell's signs present. Lower Extremity Range of Motion - No true hip, knee or ankle pain with range of motion. Gait and Station - [employer] - no assistive devices.  IMAGING Showed the spondylolisthesis L5-S1. Her MRI from 11/18/2015 confirms the spondylolisthesis with left neural foraminal compromise causing compression of the left L5 pedicle in the foramen. There is also severe facet arthrosis contributing to the neural compression.  Assessment & Plan  Goal Of Surgery: Discussed that goal of surgery is to reduce pain and improve function and quality of life. Patient is aware that despite all appropriate treatment that there pain and function could be the same, worse, or different.  Posterior Lumbar Decompression/disectomy: Risks of surgery include infection, bleeding, nerve damage, death, stroke, paralysis, failure to heal, need for further surgery, ongoing or worse pain, need for further surgery, CSF leak, loss of bowel or bladder, and  recurrent disc herniation or Stenosis which would necessitate need for further surgery.  She continues to have horrific left radicular leg pain, back pain, discomfort with forward flexion, extension and rotation, numbness and dysesthesias in the left L5 and S1 distribution from trace weakness of her EHL and gastrocnemius. Negative straight leg raise test. Compartments are soft and nontender. No incontinence of bowel or bladder. At this point in time despite a history of injection therapy as well as physical therapy, she continues to deteriorate. She states her quality of life is poor and she would like to proceed with the surgery.

## 2016-02-10 NOTE — Anesthesia Preprocedure Evaluation (Signed)
Anesthesia Evaluation  Patient identified by MRN, date of birth, ID band Patient awake    Reviewed: Allergy & Precautions, NPO status , Patient's Chart, lab work & pertinent test results  Airway Mallampati: II  TM Distance: >3 FB Neck ROM: Full    Dental  (+) Partial Upper, Teeth Intact   Pulmonary neg shortness of breath, asthma , neg sleep apnea, neg recent URI,    breath sounds clear to auscultation       Cardiovascular hypertension, Pt. on medications (-) angina(-) Past MI and (-) CHF + dysrhythmias Supra Ventricular Tachycardia + Valvular Problems/Murmurs MR  Rhythm:Regular     Neuro/Psych  Headaches, PSYCHIATRIC DISORDERS Depression  Neuromuscular disease    GI/Hepatic Neg liver ROS, GERD  Medicated and Controlled,  Endo/Other  diabetes, Type obesity  Renal/GU negative Renal ROS     Musculoskeletal  (+) Fibromyalgia -  Abdominal   Peds  Hematology negative hematology ROS (+)   Anesthesia Other Findings   Reproductive/Obstetrics                             Anesthesia Physical Anesthesia Plan  ASA: III  Anesthesia Plan: General   Post-op Pain Management:    Induction: Intravenous  Airway Management Planned: Oral ETT  Additional Equipment: None  Intra-op Plan:   Post-operative Plan: Extubation in OR  Informed Consent: I have reviewed the patients History and Physical, chart, labs and discussed the procedure including the risks, benefits and alternatives for the proposed anesthesia with the patient or authorized representative who has indicated his/her understanding and acceptance.   Dental advisory given  Plan Discussed with: CRNA and Surgeon  Anesthesia Plan Comments:         Anesthesia Quick Evaluation

## 2016-02-10 NOTE — Op Note (Signed)
NAMLucrezia [name]:  [name], [name]              ACCOUNT NO.:  [id number]  MEDICAL RECORD NO.:  [id number]  LOCATION:  3C08C                        FACILITY:  [facility]  PHYSICIAN:  Reno Clasby D. Shon BatonBrooks, M.D. 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  OPERATIVE PROCEDURE:  Transforaminal lumbar interbody fusion, L5-S1 with Titan nanoLOCK intervertebral cage packed with autograft bone and DBX mix with the NuVasive pedicle screw fixation device 45 mm in length, screws placed at L5 and 40 mm length screws at S1, all screws 6.5 diameter.  HISTORY:  This is a very pleasant, 55 year old woman who has been having significant back, buttock, and progressive radicular left leg pain. Attempts at conservative management have failed to alleviate her symptoms.  After discussing treatment options, we elected to proceed with surgery.  All appropriate risks, benefits, and alternatives were discussed with the patient and consent was obtained.  OPERATIVE NOTE:  The patient was brought to the operating room, placed supine on the operating table.  After successful induction of general anesthesia and endotracheal intubation, TEDs, SCDs, and Foley were inserted and neuro monitoring pads were placed on the lower extremities. The patient was then turned prone onto the Wilson frame and all bony prominences were well padded.  The back was then prepped and draped in standard fashion.  Time-out was taken to confirm the patient, procedure, and all other pertinent important data.  Once this was done, fluoro machine was brought sterilely into the field and we identified the lateral border of the L5 and  the S1 pedicle on the right-hand side. They were marked out and the incision sites were infiltrated with 0.25% Marcaine.  Two percutaneous incisions were made, and I advanced the Jamshidi needle down to the lateral aspect of the facet complex.  I identified the proper position and trajectory with fluoro and then advanced the Jamshidi needle into the pedicle.  Using both fluoro and neuro monitoring, there was no evidence of any breach.  There was no abnormal signal below 20 milliamps.  Once I was nearing the medial border of the pedicle on the AP view, I switched to the lateral and confirmed that the Jamshidi needle was just beyond the posterior margin of vertebral body, confirming with 2 transpedicular approach.  Once this was achieved, I then placed a guide pin down and then cannulated the S1 pedicle in the same fashion.  I then tapped over each guide pin and then placed the appropriate sized pedicle screw.  Each screw was then stimulated at 40 milliamps, and there was no abnormal EMG activity confirming no breach.  I then went to the contralateral side.  At this time, I made a small Wiltse incision.  Sharp dissection was carried out down to the deep fascia.  Deep fascia was sharply incised.  I bluntly dissected through the paraspinal tissue down to the facet complex at 4-5 and 5-1.  I placed the  Jamshidi needle and then using the same technique that I had used on the contralateral side, cannulated both pedicles and placed guide pins.  These were then tapped and then I placed the same sized screws into the pedicle.  These screws were attached to the retracting blades.  I then assembled my retractor and then stimulated each screw and again there was no activity at 40 milliamps.  I then mobilized the paraspinal muscles to expose the lamina of L5 and then placed my medial retractor.  At this point, I used an osteotome to resect the inferior L5 facet complex in its entirety.  There was  significant synovial reaction consistent with the advanced facet arthrosis seen on the MRI.  I then used a 3 mm Kerrison rongeur to perform a generous laminotomy of L5.  I then released the ligamentum flavum from the leading edge of S1 and then used a 2 mm Kerrison to resect the ligamentum flavum and expose the posterolateral aspect of the thecal sac.  This allowed me to dissect into the lateral recess removing the medial osteophyte from the medial aspect of the S1 facet and completely exposing the S1 nerve root.  I then continued bony decompression by removing the pars of L5.  There was significant osteophyte that I then encountered.  Using a Penfield 4, I dissected and created a plane between the nerve root and the bone spur and then resected it with a 3 mm Kerrison.  At this point, I could now visualize the L5 nerve root and the S1 nerve root and the thecal sac as well as the posterior osteophytes of the disk space.  I could easily take my [facility] along the path of the S1 nerve root into the foramen.  There was no significant compression.  I could also pass it superiorly into the lateral recess on the medial aspect of the pedicle of L5 and there was no significant compression.  I placed neural patties to protect the 5 and S1 nerve roots and then a neural retractor to protect the thecal sac.  An annulotomy was performed with a 15-blade scalpel and then I began using pituitary rongeurs, curettes, and Kerrison rongeurs to remove all the disk material.  Once I had all the disk material removed, I was able to hear and see bony endplate bleeding.  Satisfied with the complete diskectomy, I then measured and placed a size 11 nanoLOCK extra long Titan titanium cage packed with the local bone from the decompression as well as DBX.  Prior to placing this in, I did place the allograft bone Sheet CONFORM along the anterior annulus and then placed the cage.  Cage was well fitting in the  horizontal position and in the anterior third of the disk space.  It was not contacting or causing any neural or thecal sac compression.  I irrigated the wound copiously with normal saline and used bipolar electrocautery and Floseal to obtain and maintain hemostasis.  At this point, I then applied the polyaxial heads to each of the screws and removed the retracting device.  I removed the kyphosis that was built into the Wilson frame to get a better overall lordotic positioning to the spine before fusing her.  Once this was done, I then measured and placed a 40 mm length rod on the left side and top nuts were secured down, both top nuts were torqued off according to manufacturer standards.  I then went to the contralateral side, measured, and placed a 45  mm long screw.  Again the top locking nuts were applied and torqued off according to manufacturer standards.  Final x-rays were satisfactory.  Hardware and graft were in good position.  There were no complicating features.  All wounds were copiously irrigated with normal saline.  I then closed in a layered fashion with interrupted #1 Vicryl sutures, 2-0 Vicryl sutures, and 3-0 Monocryl.  Steri-Strips and dry dressing were applied, and the patient was ultimately extubated, transferred to the PACU without incident.  At the end of the case, all needle and sponge counts were correct.  There were no adverse intraoperative events.     Tresia Revolorio D. Shon Baton, M.D.     DDB/MEDQ  D:  02/10/2016  T:  02/10/2016  Job:  [id number]  cc:   Debria Garret D. Shon Baton, M.D.

## 2016-02-10 NOTE — Brief Op Note (Signed)
02/10/2016  12:08 PM  PATIENT:  [name] [name]  55 y.o. female  PRE-OPERATIVE DIAGNOSIS:  L5-S1 SLIP WITH RADICULAR LEFT LEG PAIN  POST-OPERATIVE DIAGNOSIS:  L5-S1 SLIP WITH RADICULAR LEFT LEG PAIN  PROCEDURE:  Procedure(s): TLIF L5-S1 (N/A)  SURGEON:  Surgeon(s) and Role:    * Venita Lickahari Anishka Bushard, MD - Primary  PHYSICIAN ASSISTANT:   ASSISTANTS: none   ANESTHESIA:   general  EBL:  Total I/O In: 1200 [I.V.:1200] Out: 300 [Urine:175; Blood:125]  BLOOD ADMINISTERED:none  DRAINS: none   LOCAL MEDICATIONS USED:  MARCAINE     SPECIMEN:  No Specimen  DISPOSITION OF SPECIMEN:  N/A  COUNTS:  YES  TOURNIQUET:  * No tourniquets in log *  DICTATION: .Other Dictation: Dictation Number [id number]  PLAN OF CARE: Admit to inpatient   PATIENT DISPOSITION:  PACU - hemodynamically stable.

## 2016-02-10 NOTE — Anesthesia Procedure Notes (Signed)
Procedure Name: Intubation Date/Time: 02/10/2016 9:44 AM Performed by: Army FossaPULLIAM, Lorian Yaun DANE Pre-anesthesia Checklist: Patient identified, Emergency Drugs available, Suction available, Patient being monitored and Timeout performed Patient Re-evaluated:Patient Re-evaluated prior to inductionOxygen Delivery Method: Circle system utilized Preoxygenation: Pre-oxygenation with 100% oxygen Intubation Type: IV induction Laryngoscope Size: Mac and 3 Grade View: Grade I Tube type: Oral Number of attempts: 1 Airway Equipment and Method: Patient positioned with wedge pillow and Stylet Placement Confirmation: ETT inserted through vocal cords under direct vision,  positive ETCO2,  CO2 detector and breath sounds checked- equal and bilateral Secured at: 22 cm Tube secured with: Tape Dental Injury: Teeth and Oropharynx as per pre-operative assessment

## 2016-02-11 ENCOUNTER — Inpatient Hospital Stay (HOSPITAL_COMMUNITY): Payer: Medicare Other

## 2016-02-11 LAB — GLUCOSE, CAPILLARY
Glucose-Capillary: 158 mg/dL — ABNORMAL HIGH (ref 65–99)
Glucose-Capillary: 128 mg/dL — ABNORMAL HIGH (ref 65–99)
Glucose-Capillary: 111 mg/dL — ABNORMAL HIGH (ref 65–99)
Glucose-Capillary: 133 mg/dL — ABNORMAL HIGH (ref 65–99)

## 2016-02-11 NOTE — Progress Notes (Signed)
    Subjective: Procedure(s) (LRB): TLIF L5-S1 (N/A) 1 Day Post-Op  Patient reports pain as 3 on 0-10 scale.  Reports decreased leg pain reports incisional back pain   Positive void Negative bowel movement Positive flatus Negative chest pain or shortness of breath  Objective: Vital signs in last 24 hours: Temp:  [97.4 F (36.3 C)-98.2 F (36.8 C)] 98.1 F (36.7 C) (07/20 0800) Pulse Rate:  [67-94] 75 (07/20 0800) Resp:  [16-20] 18 (07/20 0800) BP: (114-159)/(65-91) 134/78 mmHg (07/20 0800) SpO2:  [95 %-98 %] 95 % (07/20 0800)  Intake/Output from previous day: 07/19 0701 - 07/20 0700 In: 1880 [P.O.:480; I.V.:1400] Out: 3900 [Urine:3775; Blood:125]  Labs: No results for input(s): WBC, RBC, HCT, PLT in the last 72 hours. No results for input(s): NA, K, CL, CO2, BUN, CREATININE, GLUCOSE, CALCIUM in the last 72 hours. No results for input(s): LABPT, INR in the last 72 hours.  Physical Exam: Neurologically intact ABD soft Dorsiflexion/Plantar flexion intact Incision: dressing C/D/I and no drainage Compartment soft  Assessment/Plan: Patient stable  xrays satisfactory Continue mobilization with physical therapy Continue care  Advance diet Up with therapy  Plan on d/c tomorrow  Venita Lickahari Exodus Kutzer, MD [facility] [phone]

## 2016-02-11 NOTE — Progress Notes (Signed)
Occupational Therapy Evaluation Patient Details Name: [name] [name] MRN: [id number] DOB: 05-18-1961 Today's Date: 02/11/2016    History of Present Illness s/p TLIF L5-S1    Clinical Impression   PTA, pt independent with mobility. LB ADL were difficult due to back problems. Pt making good progress. Will plan to see in am to compete education regarding back precautions. Will issue AE as needed to maximize functional level of independence with ADL and facilitate a safe D/C home.      Follow Up Recommendations  No OT follow up;Supervision - Intermittent    Equipment Recommendations  None recommended by OT  AE kit (CAID)   Recommendations for Other Services       Precautions / Restrictions Precautions Precautions: Back Precaution Booklet Issued: Yes (comment) Required Braces or Orthoses: Spinal Brace Spinal Brace: Applied in sitting position Restrictions Weight Bearing Restrictions: No      Mobility Bed Mobility Overal bed mobility: Needs Assistance Bed Mobility: Sidelying to Sit;Sit to Sidelying   Sidelying to sit: Supervision     Sit to sidelying: Supervision    Transfers Overall transfer level: Needs assistance Equipment used: Rolling walker (2 wheeled) Transfers: Sit to/from Stand Sit to Stand: Supervision              Balance Overall balance assessment: No apparent balance deficits (not formally assessed)                                          ADL Overall ADL's : Needs assistance/impaired     Grooming: Supervision/safety;Set up;Standing   Upper Body Bathing: Set up;Sitting   Lower Body Bathing: Sit to/from stand;Minimal assistance   Upper Body Dressing : Supervision/safety;Set up;Sitting   Lower Body Dressing: Minimal assistance;Sit to/from stand   Toilet Transfer: Supervision/safety;Ambulation;RW;BSC   Toileting- Clothing Manipulation and Hygiene: Minimal assistance;Sitting/lateral lean;Sit to/from stand Toileting -  Clothing Manipulation Details (indicate cue type and reason): Educated on use of tongs for hygiene after toileting   Tub/Shower Transfer Details (indicate cue type and reason): Has tub bench. REviewed use   General ADL Comments: Pt had difficulty with ADL, especially with RLE prior to surgery adn will benefit from AE     Vision     Perception     Praxis      Pertinent Vitals/Pain Pain Assessment: 0-10 Pain Score: 5  Pain Location: back Pain Descriptors / Indicators: Aching;Operative site guarding Pain Intervention(s): Limited activity within patient's tolerance     Hand Dominance Right   Extremity/Trunk Assessment Upper Extremity Assessment Upper Extremity Assessment: Overall WFL for tasks assessed   Lower Extremity Assessment Lower Extremity Assessment: Defer to PT evaluation   Cervical / Trunk Assessment Cervical / Trunk Assessment: Normal   Communication Communication Communication: No difficulties   Cognition Arousal/Alertness: Awake/alert Behavior During Therapy: WFL for tasks assessed/performed Overall Cognitive Status: Within Functional Limits for tasks assessed                     General Comments       Exercises       Shoulder Instructions      Home Living Family/patient expects to be discharged to:: Private residence Living Arrangements: Spouse/significant other;Children Available Help at Discharge: Available 24 hours/day Type of Home: House Home Access: Stairs to enter           [employer] Shower/Tub: Tub/shower unit Shower/tub characteristics: Theatre stage manager  Toilet: Standard Bathroom Accessibility: Yes How Accessible: Accessible via walker Home Equipment: [place] - 2 wheels;Cane - single point;Bedside commode;Tub bench          Prior Functioning/Environment Level of Independence: Independent             OT Diagnosis: Generalized weakness;Acute pain   OT Problem List: Decreased strength;Decreased range of  motion;Decreased activity tolerance;Decreased knowledge of use of DME or AE;Decreased knowledge of precautions;Obesity;Pain   OT Treatment/Interventions: Self-care/ADL training;DME and/or AE instruction;Therapeutic activities;Patient/family education    OT Goals(Current goals can be found in the care plan section) Acute Rehab OT Goals Patient Stated Goal: to go home OT Goal Formulation: With patient Time For Goal Achievement: 02/18/16 Potential to Achieve Goals: Good  OT Frequency: Min 2X/week   Barriers to D/C:            Co-evaluation              End of Session Equipment Utilized During Treatment: Gait belt;Rolling walker;Back brace  Activity Tolerance: Patient tolerated treatment well Patient left: in bed;with call bell/phone within reach;with family/visitor present (sitting EOB)   Time: 9355-2174 OT Time Calculation (min): 20 min Charges:  OT General Charges $OT Visit: 1 Procedure OT Evaluation $OT Eval Moderate Complexity: 1 Procedure G-Codes:    Meesha Sek,HILLARY 02-25-2016, 11:40 AM   Maurie Boettcher, OTR/L  [phone] Feb 25, 2016

## 2016-02-11 NOTE — Evaluation (Signed)
Physical Therapy Evaluation Patient Details Name: [name] [name] MRN: [id number] DOB: [birth year] Today's Date: 02/11/2016   History of Present Illness  Pt is a 55 y/o female who presents s/p L5-S1 TLIF on 02/10/16.  Clinical Impression  Pt admitted with above diagnosis. Pt currently with functional limitations due to the deficits listed below (see PT Problem List). At the time of PT eval pt was limited by pain. Pt was able to attempt stair training with 1 person HHA but needs to practice without rails as pt does not have them at home. Pt will benefit from skilled PT to increase their independence and safety with mobility to allow discharge to the venue listed below.       Follow Up Recommendations Outpatient PT;Supervision for mobility/OOB    Equipment Recommendations  None recommended by PT    Recommendations for Other Services       Precautions / Restrictions Precautions Precautions: Fall;Back Precaution Booklet Issued: Yes (comment) Precaution Comments: Reviewed handout and pt was cued for precautions during functional mobility.  Required Braces or Orthoses: Spinal Brace Spinal Brace: Applied in sitting position Restrictions Weight Bearing Restrictions: No      Mobility  Bed Mobility Overal bed mobility: Needs Assistance Bed Mobility: Sidelying to Sit;Sit to Sidelying   Sidelying to sit: Supervision     Sit to sidelying: Supervision General bed mobility comments: Pt received returning from x-ray in w/c.   Transfers Overall transfer level: Needs assistance Equipment used: Rolling walker (2 wheeled) Transfers: Sit to/from Stand Sit to Stand: Min guard         General transfer comment: Close guard for safety. Pt was able to power-up to full standing position without assistance.   Ambulation/Gait Ambulation/Gait assistance: Min guard Ambulation Distance (Feet): 250 Feet Assistive device: Rolling walker (2 wheeled) Gait Pattern/deviations: Step-through  pattern;Decreased stride length;Trunk flexed Gait velocity: Decreased Gait velocity interpretation: Below normal speed for age/gender General Gait Details: VC's for improved posture and general safety with mobility.  Stairs Stairs: Yes Stairs assistance: Min guard Stair Management: One rail Right;Step to pattern;Forwards Number of Stairs: 10 General stair comments: Pt was cued for sequencing and general safety. Held to R rail and HHA on L to simulate having 2 people assisting her to enter home.   Wheelchair Mobility    Modified Rankin (Stroke Patients Only)       Balance Overall balance assessment: No apparent balance deficits (not formally assessed)                                           Pertinent Vitals/Pain Pain Assessment: Faces Pain Score: 5  Faces Pain Scale: Hurts even more Pain Location: Incision site Pain Descriptors / Indicators: Operative site guarding;Sore Pain Intervention(s): Monitored during session;Limited activity within patient's tolerance;Repositioned    Home Living Family/patient expects to be discharged to:: Private residence Living Arrangements: Spouse/significant other;Children Available Help at Discharge: Available 24 hours/day Type of Home: House Home Access: Stairs to enter Entrance Stairs-Rails: None Entrance Stairs-Number of Steps: 3 Home Layout: One level Home Equipment: Environmental consultant - 2 wheels;Cane - single point;Tub bench;Walker - 4 wheels;Toilet riser;Shower seat      Prior Function Level of Independence: Independent               Hand Dominance   Dominant Hand: Right    Extremity/Trunk Assessment   Upper Extremity Assessment: Defer to OT evaluation  Lower Extremity Assessment: Generalized weakness      Cervical / Trunk Assessment: Normal (Forward head posture)  Communication   Communication: No difficulties  Cognition Arousal/Alertness: Awake/alert Behavior During Therapy: WFL for tasks  assessed/performed Overall Cognitive Status: Within Functional Limits for tasks assessed                      General Comments      Exercises        Assessment/Plan    PT Assessment Patient needs continued PT services  PT Diagnosis Difficulty walking;Acute pain   PT Problem List Decreased strength;Decreased range of motion;Decreased activity tolerance;Decreased balance;Decreased mobility;Decreased knowledge of use of DME;Decreased safety awareness;Decreased knowledge of precautions;Pain  PT Treatment Interventions DME instruction;Gait training;Stair training;Functional mobility training;Therapeutic activities;Therapeutic exercise;Neuromuscular re-education;Patient/family education   PT Goals (Current goals can be found in the Care Plan section) Acute Rehab PT Goals Patient Stated Goal: to go home PT Goal Formulation: With patient Time For Goal Achievement: 02/18/16 Potential to Achieve Goals: Good    Frequency Min 5X/week   Barriers to discharge        Co-evaluation               End of Session Equipment Utilized During Treatment: Back brace Activity Tolerance: Patient limited by pain;Patient limited by fatigue Patient left: with call bell/phone within reach;with family/visitor present (Sitting EOB awaiting OT) Nurse Communication: Mobility status         Time: [id number] PT Time Calculation (min) (ACUTE ONLY): 15 min   Charges:   PT Evaluation $PT Eval Moderate Complexity: 1 Procedure     PT G Codes:        Conni SlipperKirkman, Noe Goyer 02/11/2016, 11:58 AM   Conni SlipperLaura Ketan Renz, PT, DPT Acute Rehabilitation Services Pager: [phone]

## 2016-02-12 LAB — GLUCOSE, CAPILLARY
Glucose-Capillary: 104 mg/dL — ABNORMAL HIGH (ref 65–99)
Glucose-Capillary: 107 mg/dL — ABNORMAL HIGH (ref 65–99)

## 2016-02-12 NOTE — Progress Notes (Signed)
OT Note    02/12/16 0900  OT Visit Information  Last OT Received On 02/12/16  Assistance Needed +1  History of Present Illness Pt is a 55 y/o female who presents s/p L5-S1 TLIF on 02/10/16.  Precautions  Precautions Back  Required Braces or Orthoses Spinal Brace  Spinal Brace Applied in sitting position  Pain Assessment  Pain Assessment Faces  Faces Pain Scale 4  Pain Location back  Pain Descriptors / Indicators Aching  Pain Intervention(s) Limited activity within patient's tolerance  Cognition  Arousal/Alertness Awake/alert  Behavior During Therapy WFL for tasks assessed/performed  Overall Cognitive Status Within Functional Limits for tasks assessed  ADL  General ADL Comments completed education with pt regarding useof AE for LB ADL. Pt able to return demosntrate use of AE and able to achieve mod i level with AE. Educated on home safety and reducing risk of falls  Bed Mobility  Overal bed mobility Needs Assistance  General bed mobility comments s for correct technique  Balance  Overall balance assessment No apparent balance deficits (not formally assessed)  Transfers  Overall transfer level Modified independent  Transfers Sit to/from Stand  Sit to Stand Modified independent (Device/Increase time)  OT - End of Session  Equipment Utilized During Treatment Rolling walker;Back brace  Activity Tolerance Patient tolerated treatment well  Patient left in bed;with call bell/phone within reach;with family/visitor present  Nurse Communication Mobility status  OT Assessment/Plan  OT Plan Discharge plan remains appropriate  OT Frequency (ACUTE ONLY) Min 2X/week  Follow Up Recommendations No OT follow up;Supervision - Intermittent  OT Equipment None recommended by OT  OT Goal Progression  Progress towards OT goals Goals met/education completed, patient discharged from OT  Acute Rehab OT Goals  Patient Stated Goal to go home  OT Goal Formulation With patient  Time For Goal  Achievement 02/18/16  Potential to Achieve Goals Good  ADL Goals  Pt Will Perform Lower Body Bathing with modified independence;with adaptive equipment;sit to/from stand;with caregiver independent in assisting  Pt Will Perform Lower Body Dressing with modified independence;with caregiver independent in assisting;sit to/from stand;with adaptive equipment  Pt Will Transfer to Toilet with modified independence;ambulating;bedside commode  Pt Will Perform Toileting - Clothing Manipulation and hygiene with modified independence;sitting/lateral leans;with adaptive equipment  Additional ADL Goal #1 Pt will verbalize 3/3 back precautions independently  OT Time Calculation  OT Start Time (ACUTE ONLY) 0850  OT Stop Time (ACUTE ONLY) 0905  OT Time Calculation (min) 15 min  OT General Charges  $OT Visit 1 Procedure  OT Treatments  $Self Care/Home Management  8-22 mins  [facility], OTR/L  [phone] 02/12/2016

## 2016-02-12 NOTE — Progress Notes (Signed)
    Subjective: Procedure(s) (LRB): TLIF L5-S1 (N/A) 2 Days Post-Op  Patient reports pain as 3 on 0-10 scale.  Reports decreased leg pain reports incisional back pain   Positive void Positive bowel movement Positive flatus Negative chest pain or shortness of breath  Objective: Vital signs in last 24 hours: Temp:  [98.1 F (36.7 C)-99.1 F (37.3 C)] 98.4 F (36.9 C) (07/21 0314) Pulse Rate:  [72-79] 73 (07/21 0314) Resp:  [18-20] 18 (07/21 0314) BP: (118-140)/(72-80) 118/80 mmHg (07/21 0314) SpO2:  [94 %-100 %] 96 % (07/21 0314)  Intake/Output from previous day: 07/20 0701 - 07/21 0700 In: 840 [P.O.:840] Out: 500 [Urine:500]  Labs: No results for input(s): WBC, RBC, HCT, PLT in the last 72 hours. No results for input(s): NA, K, CL, CO2, BUN, CREATININE, GLUCOSE, CALCIUM in the last 72 hours. No results for input(s): LABPT, INR in the last 72 hours.  Physical Exam: Neurologically intact Neurovascular intact Intact pulses distally Incision: dressing C/D/I Compartment soft  Assessment/Plan: Patient stable  xrays satisfactory Continue mobilization with physical therapy Continue care  Advance diet Up with therapy  D/c this afternoon  Venita Lickahari Eliyahu Bille, MD [facility] [phone]

## 2016-02-12 NOTE — Progress Notes (Signed)
Patient alert and oriented, mae's well, voiding adequate amount of urine, swallowing without difficulty, c/o pain and medication given prior to discharged. Patient discharged home with family. Script and discharged instructions given to patient. Patient and family stated understanding of d/c instructions given and has an appointment with MD.  

## 2016-02-12 NOTE — Anesthesia Postprocedure Evaluation (Signed)
Anesthesia Post Note  Patient: [name] KitchensCheryl E Lacivita  Procedure(s) Performed: Procedure(s) (LRB): TLIF L5-S1 (N/A)  Patient location during evaluation: PACU Anesthesia Type: General Level of consciousness: awake Pain management: pain level controlled Vital Signs Assessment: post-procedure vital signs reviewed and stable Respiratory status: spontaneous breathing Cardiovascular status: stable Postop Assessment: no signs of nausea or vomiting Anesthetic complications: no    Last Vitals:  Filed Vitals:   02/12/16 0740 02/12/16 1132  BP: 103/67 112/69  Pulse: 75 72  Temp: 37 C 37.1 C  Resp: 16 18    Last Pain:  Filed Vitals:   02/12/16 1251  PainSc: 3                  Sharbel Sahagun

## 2016-02-12 NOTE — Progress Notes (Signed)
Physical Therapy Treatment Patient Details Name: [name] [name] MRN: [id number] DOB: 1960-11-29 Today's Date: 02/12/2016    History of Present Illness Pt is a 55 y/o female who presents s/p L5-S1 TLIF on 02/10/16.    PT Comments    Pt progressing towards physical therapy goals. Was able to perform transfers and ambulation with min guard to supervision for safety. Reviewed precautions and general safety for home. Will continue to follow.   Follow Up Recommendations  Outpatient PT;Supervision for mobility/OOB     Equipment Recommendations  None recommended by PT    Recommendations for Other Services       Precautions / Restrictions Precautions Precautions: Fall;Back Precaution Booklet Issued: Yes (comment) Precaution Comments: Reviewed handout and pt was cued for precautions during functional mobility.  Required Braces or Orthoses: Spinal Brace Spinal Brace: Applied in sitting position Restrictions Weight Bearing Restrictions: No    Mobility  Bed Mobility Overal bed mobility: Needs Assistance Bed Mobility: Rolling;Sidelying to Sit Rolling: Supervision Sidelying to sit: Supervision       General bed mobility comments: Increased time to transition to full sitting position EOB. VC's required for proper log roll technique.  Transfers Overall transfer level: Needs assistance Equipment used: Rolling walker (2 wheeled) Transfers: Sit to/from Stand Sit to Stand: Min guard         General transfer comment: Close guard for safety. Pt was able to power-up to full standing position without assistance.   Ambulation/Gait Ambulation/Gait assistance: Supervision Ambulation Distance (Feet): 250 Feet Assistive device: Rolling walker (2 wheeled) Gait Pattern/deviations: Step-through pattern;Decreased stride length;Trunk flexed Gait velocity: Decreased Gait velocity interpretation: Below normal speed for age/gender General Gait Details: VC's for improved posture and general  safety with mobility.   Stairs Stairs: Yes Stairs assistance: Min guard Stair Management: One rail Right;Forwards Number of Stairs: 10 General stair comments: Pt was cued for sequencing and general safety.   Wheelchair Mobility    Modified Rankin (Stroke Patients Only)       Balance Overall balance assessment: No apparent balance deficits (not formally assessed)                                  Cognition Arousal/Alertness: Awake/alert Behavior During Therapy: WFL for tasks assessed/performed Overall Cognitive Status: Within Functional Limits for tasks assessed                      Exercises      General Comments        Pertinent Vitals/Pain Pain Assessment: Faces Faces Pain Scale: Hurts even more Pain Location: Incision site Pain Descriptors / Indicators: Operative site guarding;Sore Pain Intervention(s): Limited activity within patient's tolerance;Monitored during session;Repositioned    Home Living Family/patient expects to be discharged to:: Private residence Living Arrangements: Spouse/significant other;Children Available Help at Discharge: Available 24 hours/day Type of Home: House Home Access: Stairs to enter Entrance Stairs-Rails: None Home Layout: One level Home Equipment: Environmental consultant - 2 wheels;Cane - single point;Tub bench;Walker - 4 wheels;Toilet riser;Shower seat      Prior Function Level of Independence: Independent          PT Goals (current goals can now be found in the care plan section) Acute Rehab PT Goals Patient Stated Goal: to go home PT Goal Formulation: With patient Time For Goal Achievement: 02/18/16 Potential to Achieve Goals: Good Progress towards PT goals: Progressing toward goals    Frequency  Min 5X/week  PT Plan Current plan remains appropriate    Co-evaluation             End of Session Equipment Utilized During Treatment: Back brace Activity Tolerance: Patient limited by pain;Patient  limited by fatigue Patient left: with call bell/phone within reach;with family/visitor present (Sitting EOB awaiting OT)     Time: [id number] PT Time Calculation (min) (ACUTE ONLY): 19 min  Charges:  $Gait Training: 8-22 mins                    G Codes:      [name] [name], [name] [name] 02/12/2016, 1:26 PM   [name] [name] [name] [name], PT, DPT Acute Rehabilitation Services Pager: [phone]

## 2016-03-01 NOTE — Discharge Summary (Signed)
Physician Discharge Summary  Patient ID: [name] [name] MRN: [id number] DOB/AGE: 02-09-1961 55 y.o.  Admit date: 02/10/2016 Discharge date: 03/01/2016  Admission Diagnoses:  Lumbar DDD  Discharge Diagnoses:  Active Problems:   Back pain   Past Medical History:  Diagnosis Date  . Allergy   . Asthma   . Chronic back pain   . Depression   . Diabetes mellitus without complication (HCC)    type 2  . Dysrhythmia    hisotry of tachycardia  . Fibromyalgia   . GERD (gastroesophageal reflux disease)   . Headache   . Hypercholesteremia   . Hypertension   . Sickle cell trait (HCC)     Surgeries: Procedure(s): TLIF L5-S1 on 02/10/2016   Consultants (if any):   Discharged Condition: Improved  Hospital Course: [name] [name] is an 55 y.o. female who was admitted 02/10/2016 with a diagnosis of Lumbar DDD and went to the operating room on 02/10/2016 and underwent the above named procedures.  Pt discharged on 02/12/16.  She was given perioperative antibiotics:  Anti-infectives    Start     Dose/Rate Route Frequency Ordered Stop   02/10/16 1600  ceFAZolin (ANCEF) IVPB 1 g/50 mL premix     1 g 100 mL/hr over 30 Minutes Intravenous Every 8 hours 02/10/16 1333 02/10/16 2352   02/10/16 0720  ceFAZolin (ANCEF) IVPB 2g/100 mL premix     2 g 200 mL/hr over 30 Minutes Intravenous 30 min pre-op 02/10/16 0720 02/10/16 0856    .  She was given sequential compression devices, early ambulation, and TED for DVT prophylaxis.  She benefited maximally from the hospital stay and there were no complications.    Recent vital signs:  Vitals:   02/12/16 0740 02/12/16 1132  BP: 103/67 112/69  Pulse: 75 72  Resp: 16 18  Temp: 98.6 F (37 C) 98.7 F (37.1 C)    Recent laboratory studies:  Lab Results  Component Value Date   HGB 13.1 02/02/2016   Lab Results  Component Value Date   WBC 5.5 02/02/2016   PLT 232 02/02/2016   No results found for: INR Lab Results  Component Value Date    NA 138 02/02/2016   K 3.4 (L) 02/02/2016   CL 101 02/02/2016   CO2 28 02/02/2016   BUN 12 02/02/2016   CREATININE 0.96 02/02/2016   GLUCOSE 104 (H) 02/02/2016    Discharge Medications:     Medication List    STOP taking these medications   oxyCODONE 15 MG immediate release tablet Commonly known as:  ROXICODONE     TAKE these medications   atorvastatin 80 MG tablet Commonly known as:  LIPITOR Take 80 mg by mouth daily.   EPIPEN 2-PAK 0.3 mg/0.3 mL Soaj injection Generic drug:  EPINEPHrine Inject 0.3 mg into the muscle once.   esomeprazole 40 MG capsule Commonly known as:  NEXIUM TAKE ONE CAPSULE ONCE DAILY   irbesartan-hydrochlorothiazide 300-12.5 MG tablet Commonly known as:  AVALIDE Take 1 tablet by mouth daily.   loratadine 10 MG tablet Commonly known as:  CLARITIN Take 1 tablet (10 mg total) by mouth daily as needed for allergies.   methocarbamol 500 MG tablet Commonly known as:  ROBAXIN Take 1 tablet (500 mg total) by mouth 3 (three) times daily as needed for muscle spasms.   montelukast 10 MG tablet Commonly known as:  SINGULAIR Take 1 tablet (10 mg total) by mouth daily.   ondansetron 4 MG tablet Commonly known as:  ZOFRAN Take 1 tablet (4 mg total) by mouth every 8 (eight) hours as needed for nausea or vomiting.   oxyCODONE-acetaminophen 10-325 MG tablet Commonly known as:  PERCOCET Take 1 tablet by mouth every 4 (four) hours as needed for pain.   PROAIR HFA 108 (90 Base) MCG/ACT inhaler Generic drug:  albuterol Inhale 2 puffs into the lungs every 4 (four) hours as needed for wheezing or shortness of breath.   ranitidine 300 MG tablet Commonly known as:  ZANTAC Take 1 tablet (300 mg total) by mouth at bedtime.   sertraline 25 MG tablet Commonly known as:  ZOLOFT Take 25 mg by mouth daily.   VICTOZA 18 MG/3ML Sopn Generic drug:  Liraglutide Inject 1.8 mg into the skin daily.   WELCHOL 625 MG tablet Generic drug:  colesevelam Take 975  mg by mouth 2 (two) times daily.       Diagnostic Studies: Dg Lumbar Spine 2-3 Views  Result Date: 02/11/2016 CLINICAL DATA:  Some back pain S/P lumbar fusion EXAM: LUMBAR SPINE - 2-3 VIEW COMPARISON:  02/10/2016 FINDINGS: Patient has had posterior fusion at L5-S1 with interbody fusion device. Alignment appears normal and unchanged. Mild degenerative changes are identified in the lower lumbar levels. IMPRESSION: Unchanged alignment following posterior fusion at L5-S1 Electronically Signed   By: Norva PavlovElizabeth  Brown M.D.   On: 02/11/2016 08:46   Dg Lumbar Spine 2-3 Views  Result Date: 02/10/2016 CLINICAL DATA:  Lumbar surgery. EXAM: DG C-ARM 61-120 MIN; LUMBAR SPINE - 2-3 VIEW COMPARISON:  MRI 11/18/2015. FINDINGS: Lumbar spine numbered as per prior MRI. L5-S1 posterior and interbody fusion. Two images obtained. Good anatomic alignment. Hardware intact . 3 minutes 40 seconds fluoroscopy time utilized. IMPRESSION: L5-S1 posterior and interbody fusion. Electronically Signed   By: Maisie Fushomas  Register   On: 02/10/2016 12:05   Dg C-arm 61-120 Min  Result Date: 02/10/2016 CLINICAL DATA:  Lumbar surgery. EXAM: DG C-ARM 61-120 MIN; LUMBAR SPINE - 2-3 VIEW COMPARISON:  MRI 11/18/2015. FINDINGS: Lumbar spine numbered as per prior MRI. L5-S1 posterior and interbody fusion. Two images obtained. Good anatomic alignment. Hardware intact . 3 minutes 40 seconds fluoroscopy time utilized. IMPRESSION: L5-S1 posterior and interbody fusion. Electronically Signed   By: Maisie Fushomas  Register   On: 02/10/2016 12:05    Disposition: 01-Home or Self Care    Follow-up Information    [name] [name],DAHARI D, MD. Schedule an appointment as soon as possible for a visit in 2 weeks.   Specialty:  Orthopedic Surgery Why:  For suture removal, For wound re-check, If symptoms worsen Contact information: [address] [place] [address] [phone]            Signed: Kirt BoysMayo, Saveah Bahar Christina 03/01/2016, 1:09 PM

## 2016-10-12 ENCOUNTER — Other Ambulatory Visit (HOSPITAL_COMMUNITY)
Admission: RE | Admit: 2016-10-12 | Discharge: 2016-10-12 | Disposition: A | Discharge status: Home / Self Care | Payer: Medicare Other | Source: Other Acute Inpatient Hospital | Attending: Family Medicine | Admitting: Family Medicine

## 2016-10-12 DIAGNOSIS — R928 Other abnormal and inconclusive findings on diagnostic imaging of breast: Secondary | ICD-10-CM | POA: Insufficient documentation

## 2017-10-04 ENCOUNTER — Other Ambulatory Visit: Payer: Self-pay

## 2018-03-05 IMAGING — DX DG LUMBAR SPINE 2-3V
2 series · 2 of 2 positions shown · non-contrast
Comparison: 02/10/2016

CLINICAL DATA: Some back pain S/P lumbar fusion

EXAM:
LUMBAR SPINE - 2-3 VIEW

[t lumbar spine ap]
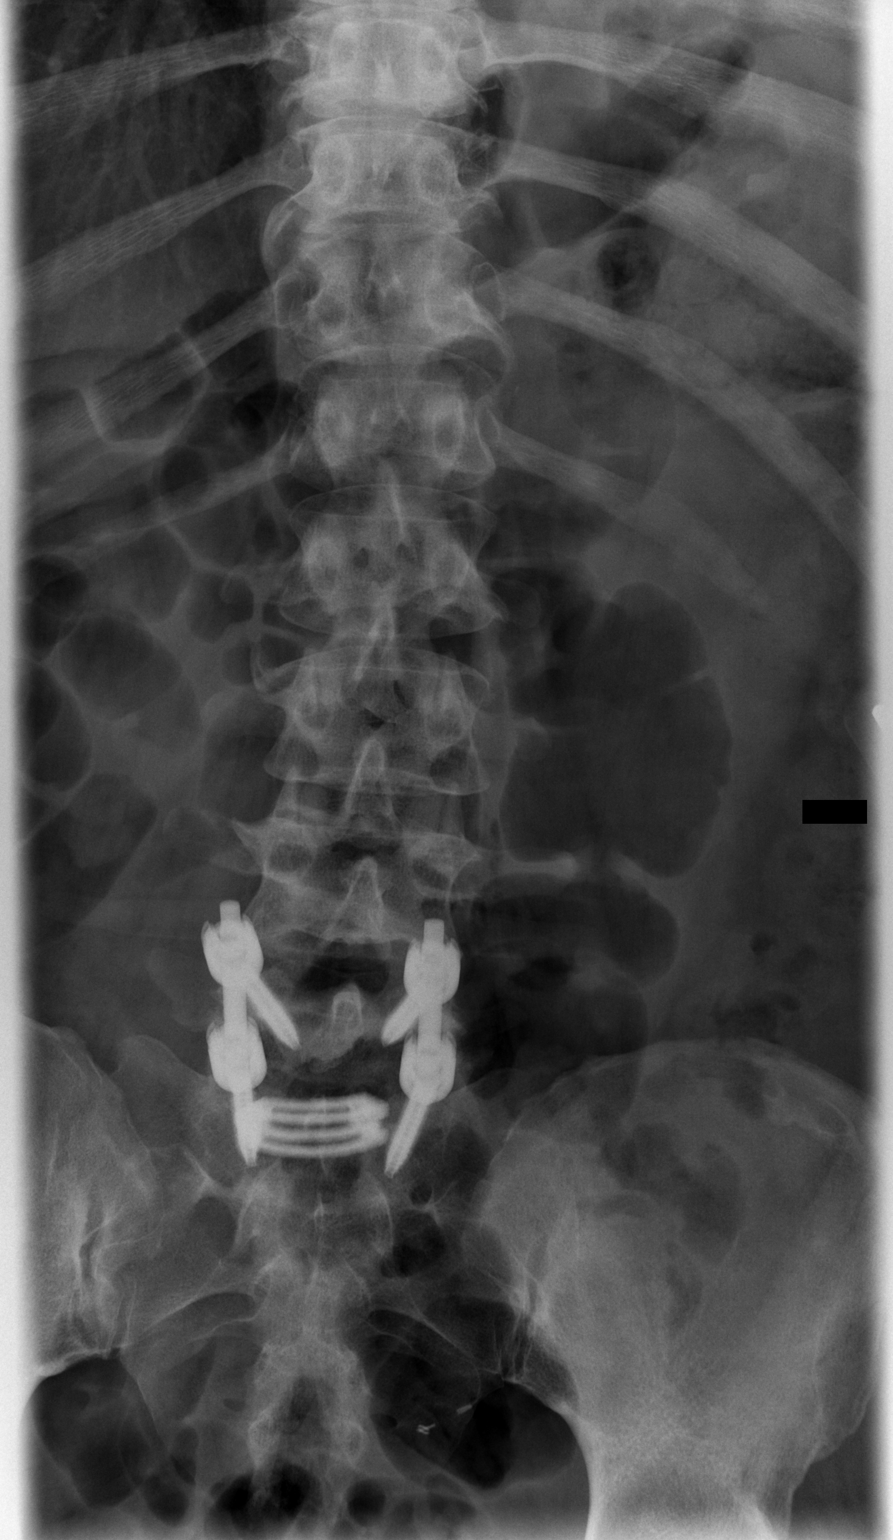

[t lumbar spine lat]
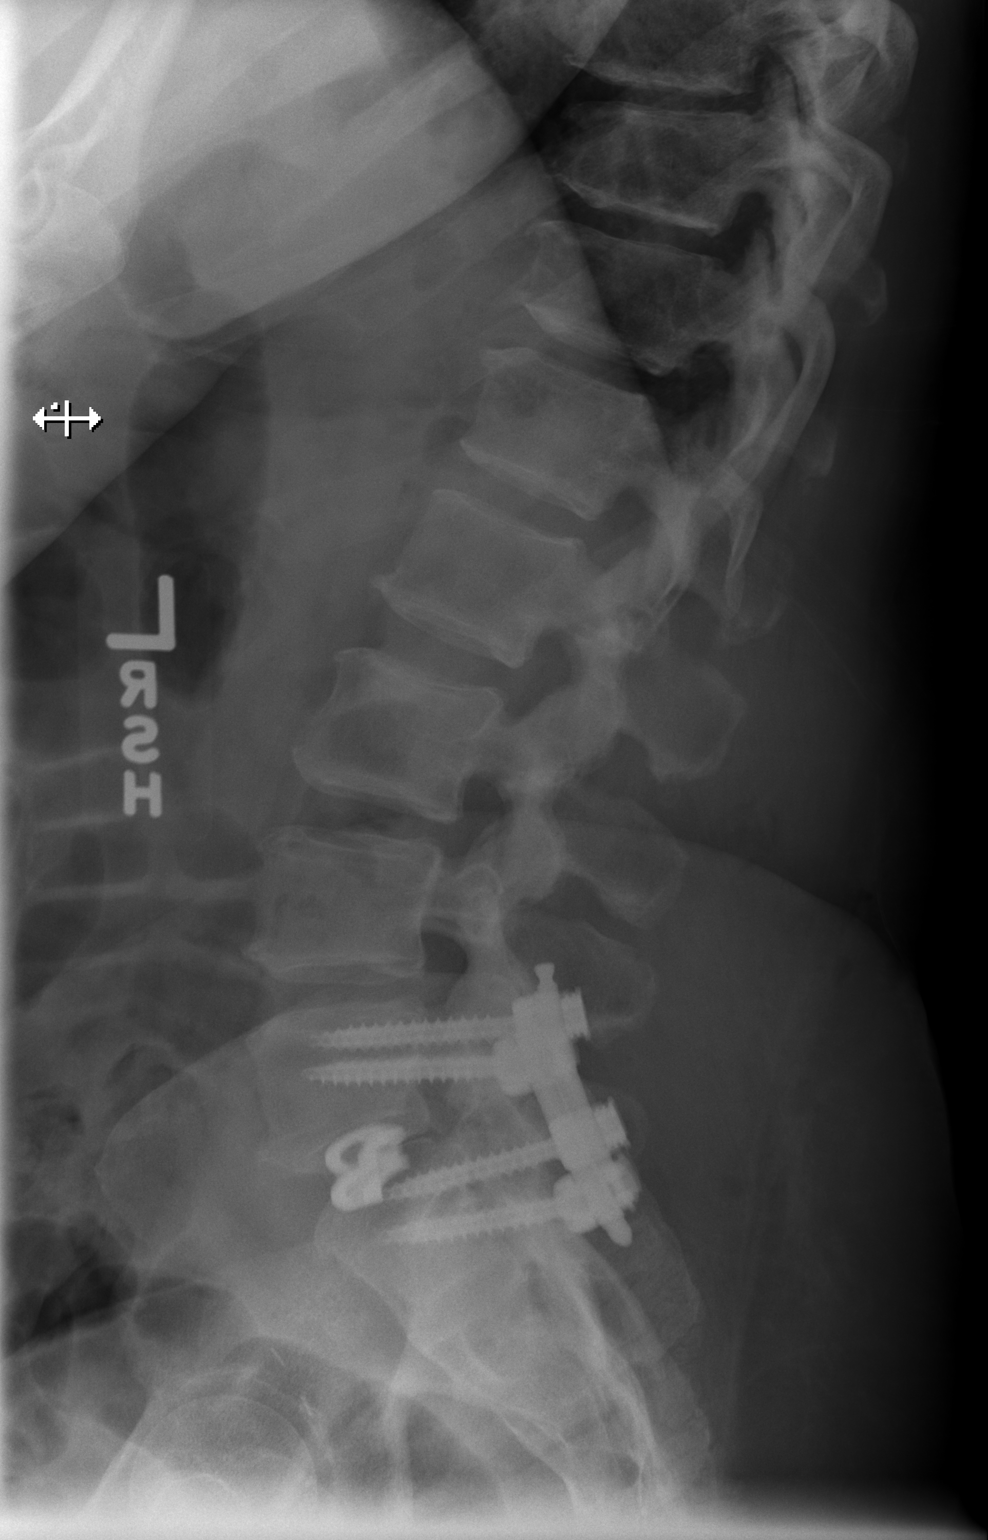

[2 of 2 positions shown; findings below may reference images not displayed]

FINDINGS: Patient has had posterior fusion at L5-S1 with interbody fusion
device. Alignment appears normal and unchanged. Mild degenerative
changes are identified in the lower lumbar levels.
IMPRESSION: Unchanged alignment following posterior fusion at L5-S1

## 2018-05-17 ENCOUNTER — Encounter: Payer: Self-pay | Admitting: Gastroenterology

## 2019-10-02 ENCOUNTER — Ambulatory Visit (INDEPENDENT_AMBULATORY_CARE_PROVIDER_SITE_OTHER): Payer: Medicare Other | Admitting: Plastic Surgery

## 2019-10-02 ENCOUNTER — Other Ambulatory Visit: Payer: Self-pay

## 2019-10-02 ENCOUNTER — Encounter: Payer: Self-pay | Admitting: Plastic Surgery

## 2019-10-02 VITALS — BP 121/84 | HR 90 | Temp 97.3°F | Ht 64.0 in | Wt 218.8 lb

## 2019-10-02 DIAGNOSIS — N62 Hypertrophy of breast: Secondary | ICD-10-CM | POA: Diagnosis not present

## 2019-10-02 NOTE — Progress Notes (Signed)
Referring Provider Venita Lick, MD [address] [place],  [place] [address]   CC:  Chief Complaint  Patient presents with  . Advice Only    Bilateral breast reduction      [name] [name] is an 59 y.o. female.  HPI: Patient presents to discuss breast reduction.  She was sent by her orthopedic surgeon Dr. Shon Baton.  She has had chronic back pain that has not been relieved with anti-inflammatory medications.  She is a 30 8J and wants to be a double D.  Her last mammogram was in 2019 which required a biopsy that was normal.  She does get intermittent rashes beneath her breast had been treated with creams and powders with limited success.  She is a diabetic and her most hemoglobin most recent hemoglobin A1c is 7.6.  She has another one pending soon.  Allergies  Allergen Reactions  . Eggs Or Egg-Derived Products Anaphylaxis  . Tea Anaphylaxis  . Azithromycin Hives  . Celebrex [Celecoxib] Nausea Only    Stomach upset  . Contrast Media [Iodinated Diagnostic Agents] Nausea And Vomiting  . Flax Seed [Bio-Flax] Hives  . Grapeseed Extract [Nutritional Supplements] Hives  . Lisinopril Hives  . Morphine And Related Other (See Comments)    Doesn't help pain  . Other Hives    [place] Beans, strawberries, chicken embryo derived  . Paroxetine Other (See Comments)    Hallucinations  . Shellfish Allergy Hives  . Tramadol Hives  . Vicodin [Hydrocodone-Acetaminophen] Hives and Itching    Outpatient Encounter Medications as of 10/02/2019  Medication Sig  . albuterol (PROAIR HFA) 108 (90 BASE) MCG/ACT inhaler Inhale 2 puffs into the lungs every 4 (four) hours as needed for wheezing or shortness of breath.  Marland Kitchen atorvastatin (LIPITOR) 80 MG tablet Take 80 mg by mouth daily.  Marland Kitchen EPINEPHrine (EPIPEN 2-PAK) 0.3 mg/0.3 mL IJ SOAJ injection Inject 0.3 mg into the muscle once.  Marland Kitchen esomeprazole (NEXIUM) 40 MG capsule TAKE ONE CAPSULE ONCE DAILY  . irbesartan-hydrochlorothiazide (AVALIDE)  300-12.5 MG tablet Take 1 tablet by mouth daily.  Marland Kitchen loratadine (CLARITIN) 10 MG tablet Take 1 tablet (10 mg total) by mouth daily as needed for allergies.  . methocarbamol (ROBAXIN) 500 MG tablet Take 1 tablet (500 mg total) by mouth 3 (three) times daily as needed for muscle spasms.  . montelukast (SINGULAIR) 10 MG tablet Take 1 tablet (10 mg total) by mouth daily.  . ondansetron (ZOFRAN) 4 MG tablet Take 1 tablet (4 mg total) by mouth every 8 (eight) hours as needed for nausea or vomiting.  Marland Kitchen oxyCODONE-acetaminophen (PERCOCET) 10-325 MG tablet Take 1 tablet by mouth every 4 (four) hours as needed for pain.  . ranitidine (ZANTAC) 300 MG tablet Take 1 tablet (300 mg total) by mouth at bedtime.  . sertraline (ZOLOFT) 25 MG tablet Take 25 mg by mouth daily.  Marland Kitchen VICTOZA 18 MG/3ML SOPN Inject 1.8 mg into the skin daily.  Lilian Kapur 625 MG tablet Take 975 mg by mouth 2 (two) times daily.   No facility-administered encounter medications on file as of 10/02/2019.     Past Medical History:  Diagnosis Date  . Allergy   . Asthma   . Chronic back pain   . Depression   . Diabetes mellitus without complication (HCC)    type 2  . Dysrhythmia    hisotry of tachycardia  . Fibromyalgia   . GERD (gastroesophageal reflux disease)   . Headache   . Hypercholesteremia   .  Hypertension   . Sickle cell trait [facility])     Past Surgical History:  Procedure Laterality Date  . ABDOMINAL HYSTERECTOMY    . CESAREAN SECTION    . COLONOSCOPY  2017  . EXPLORATORY LAPAROTOMY    . KNEE ARTHROSCOPY     2 on left, one on right    No family history on file.  Social History   Social History Narrative  . Not on file     Review of Systems General: Denies fevers, chills, weight loss CV: Denies chest pain, shortness of breath, palpitations  Physical Exam Vitals with BMI 10/02/2019 02/12/2016 02/12/2016  Height 5\' 4"  - -  Weight 218 lbs 13 oz - -  BMI 00.17 - -  Systolic 494 496 759  Diastolic 84 69 67    Pulse 90 72 75    General:  No acute distress,  Alert and oriented, Non-Toxic, Normal speech and affect Breast: She has grade 3 ptosis.  Sternal notch to nipple is 40 cm bilaterally.  Nipple to fold is 21 on the right and 18 on the left.  I do not see any obvious masses or scars.  Assessment/Plan The patient has bilateral symptomatic macromastia.  She is a good candidate for a breast reduction.  The details of breast reduction surgery were discussed.  I explained the procedure in detail along the with the expected scars.  The risks were discussed in detail and include bleeding, infection, damage to surrounding structures, need for additional procedures, nipple loss, change in nipple sensation, persistent pain, contour irregularities and asymmetries.  I explained that breast feeding is often not possible after breast reduction surgery.  We discussed the expected postoperative course with an overall recovery period of about 1 month.  She demonstrated full understanding of all risks.  We discussed her personal risk factors that include high BMI and diabetes.  I explained that if her most current hemoglobin A1c was 8 or greater we would postpone the surgery.  I anticipate approximately 800 g of tissue removed from each side.   Cindra Presume 10/02/2019, 11:23 AM

## 2019-10-10 ENCOUNTER — Telehealth: Payer: Self-pay

## 2019-10-10 NOTE — Telephone Encounter (Signed)
Called patient if she got a copy of her MMG. She said no and to call the [facility], [facility] in the imaging department to call back.

## 2019-10-10 NOTE — Telephone Encounter (Signed)
Called patient to check on status of copy of MMG results back in 2019. She did not get them. Faxed to [facility] requested MMG findings.

## 2019-10-10 NOTE — Telephone Encounter (Signed)
-----   Message from Allena Napoleon, MD sent at 10/04/2019 11:03 AM EST ----- Regarding: RE: MMG She said she had one in 2019 that required a biopsy that was benign.  I'm guessing it was in a different system.  If she needs one that's more current then let's send her for one.   ----- Message ----- From: Frederik Schmidt Sent: 10/04/2019  10:58 AM EST To: Allena Napoleon, MD Subject: MMG                                            Hey! I don't see a MMG on file for this patient at all. Do you know if she's had one? If not, we may need one to give to insurance before they will approve her (age).

## 2019-10-29 ENCOUNTER — Encounter: Payer: Self-pay | Admitting: Surgical

## 2019-10-29 ENCOUNTER — Ambulatory Visit (INDEPENDENT_AMBULATORY_CARE_PROVIDER_SITE_OTHER): Payer: Medicare Other | Admitting: Surgical

## 2019-10-29 ENCOUNTER — Telehealth: Payer: Self-pay | Admitting: Plastic Surgery

## 2019-10-29 ENCOUNTER — Other Ambulatory Visit: Payer: Self-pay

## 2019-10-29 VITALS — BP 127/88 | HR 74 | Temp 98.4°F | Ht 63.5 in | Wt 214.0 lb

## 2019-10-29 DIAGNOSIS — N62 Hypertrophy of breast: Secondary | ICD-10-CM

## 2019-10-29 MED ORDER — OXYCODONE-ACETAMINOPHEN 5-325 MG PO TABS: 1.0000 | ORAL_TABLET | Freq: Four times a day (QID) | ORAL | 0 refills | Status: AC | PRN

## 2019-10-29 NOTE — Telephone Encounter (Signed)
Called [facility] back and spoke with Amy. She notified us that patient goes to pain clinic and gets refills on oxycodone every 30 days for 120 tabs. Spoke with Waller and he is aware of the situation. He advised it was ok and should be taken after surgery.Called Amy back and advised her that is was ok to have prescription filled and she has surgery next week.

## 2019-10-29 NOTE — Progress Notes (Signed)
Patient ID: [name] [name], female    DOB: 12/03/1960, 59 y.o.   MRN: [id number]  Chief Complaint  Patient presents with  . Pre-op Exam      ICD-10-CM   1. Macromastia  N62     History of Present Illness: [name] [name] is a 59 y.o.  female  with a history of macromastia.  She presents for preoperative evaluation for upcoming procedure, bilateral breast reduction, scheduled for 11/05/2019 with Dr. Arita Miss. Patient has a history of chronic back pain that has been unrelieved with chronic pain control (opioids). She also has a history of intermittent rashes within the breast fold, limited success with cream and powder treatments  The patient has not had problems with anesthesia. No hx of dvt/pe. No fmhx of dvt/pe. She is currently taking aspirin daily. No bleeding or clotting disorders. No recent colds/illnesses.   Last mammogram 2019 with a biopsy that was normal. She has previously faxed the results to our office.  She has a past surgical history of hysterectomy, colonoscopy,multiple knee arthroscopies, exlap - tolerated these procedures without any post-operative anesthetic complications.  Patient has a past medical history of diabetes (most recent A1c 7), asthma, chronic back pain, history of tachycardia, hypercholesterolemia, fibromyalgia, GERD, hypertension.   Estimated 800 g removal from each breast.  Past Medical History: Allergies: Allergies  Allergen Reactions  . Eggs Or Egg-Derived Products Anaphylaxis  . Tea Anaphylaxis  . Azithromycin Hives  . Celebrex [Celecoxib] Nausea Only    Stomach upset  . Contrast Media [Iodinated Diagnostic Agents] Nausea And Vomiting  . Flax Seed [Bio-Flax] Hives  . Grapeseed Extract [Nutritional Supplements] Hives  . Lisinopril Hives  . Morphine And Related Other (See Comments)    Doesn't help pain  . Other Hives    [place] Beans, strawberries, chicken embryo derived  . Paroxetine Other (See Comments)    Hallucinations  . Shellfish  Allergy Hives  . Tramadol Hives  . Vicodin [Hydrocodone-Acetaminophen] Hives and Itching    Current Medications:  Current Outpatient Medications:  .  aspirin EC 81 MG tablet, Take 81 mg by mouth daily., Disp: , Rfl:  .  metFORMIN (GLUCOPHAGE) 1000 MG tablet, Take 1,000 mg by mouth 2 (two) times daily with a meal., Disp: , Rfl:  .  potassium chloride (KLOR-CON) 10 MEQ tablet, Take 10 mEq by mouth daily., Disp: , Rfl:  .  albuterol (PROAIR HFA) 108 (90 BASE) MCG/ACT inhaler, Inhale 2 puffs into the lungs every 4 (four) hours as needed for wheezing or shortness of breath., Disp: , Rfl:  .  atorvastatin (LIPITOR) 80 MG tablet, Take 80 mg by mouth daily., Disp: , Rfl: 11 .  EPINEPHrine (EPIPEN 2-PAK) 0.3 mg/0.3 mL IJ SOAJ injection, Inject 0.3 mg into the muscle once., Disp: , Rfl:  .  esomeprazole (NEXIUM) 40 MG capsule, TAKE ONE CAPSULE ONCE DAILY, Disp: 30 capsule, Rfl: 0 .  irbesartan-hydrochlorothiazide (AVALIDE) 300-12.5 MG tablet, Take 1 tablet by mouth daily., Disp: , Rfl: 11 .  loratadine (CLARITIN) 10 MG tablet, Take 1 tablet (10 mg total) by mouth daily as needed for allergies., Disp: 30 tablet, Rfl: 0 .  methocarbamol (ROBAXIN) 500 MG tablet, Take 1 tablet (500 mg total) by mouth 3 (three) times daily as needed for muscle spasms., Disp: 60 tablet, Rfl: 0 .  montelukast (SINGULAIR) 10 MG tablet, Take 1 tablet (10 mg total) by mouth daily., Disp: 30 tablet, Rfl: 0 .  oxyCODONE-acetaminophen (PERCOCET) 10-325 MG tablet, Take 1 tablet  by mouth every 4 (four) hours as needed for pain., Disp: 60 tablet, Rfl: 0 .  ranitidine (ZANTAC) 300 MG tablet, Take 1 tablet (300 mg total) by mouth at bedtime., Disp: 30 tablet, Rfl: 0 .  sertraline (ZOLOFT) 25 MG tablet, Take 25 mg by mouth daily., Disp: , Rfl: 11 .  VICTOZA 18 MG/3ML SOPN, Inject 1.8 mg into the skin daily., Disp: , Rfl: 3 .  WELCHOL 625 MG tablet, Take 975 mg by mouth 2 (two) times daily., Disp: , Rfl: 3  Past Medical Problems: Past  Medical History:  Diagnosis Date  . Allergy   . Asthma   . Chronic back pain   . Depression   . Diabetes mellitus without complication ([place])    type 2  . Dysrhythmia    hisotry of tachycardia  . Fibromyalgia   . GERD (gastroesophageal reflux disease)   . Headache   . Hypercholesteremia   . Hypertension   . Sickle cell trait [facility])     Past Surgical History: Past Surgical History:  Procedure Laterality Date  . ABDOMINAL HYSTERECTOMY    . CESAREAN SECTION    . COLONOSCOPY  2017  . EXPLORATORY LAPAROTOMY    . KNEE ARTHROSCOPY     2 on left, one on right    Social History: Social History   Socioeconomic History  . Marital status: Single    Spouse name: Not on file  . Number of children: Not on file  . Years of education: Not on file  . Highest education level: Not on file  Occupational History  . Not on file  Tobacco Use  . Smoking status: Never Smoker  . Smokeless tobacco: Never Used  Substance and Sexual Activity  . Alcohol use: No  . Drug use: No  . Sexual activity: Not on file  Other Topics Concern  . Not on file  Social History Narrative  . Not on file   Social Determinants of Health   Financial Resource Strain:   . Difficulty of Paying Living Expenses:   Food Insecurity:   . Worried About Charity fundraiser in the Last Year:   . Arboriculturist in the Last Year:   Transportation Needs:   . Film/video editor (Medical):   Marland Kitchen Lack of Transportation (Non-Medical):   Physical Activity:   . Days of Exercise per Week:   . Minutes of Exercise per Session:   Stress:   . Feeling of Stress :   Social Connections:   . Frequency of Communication with Friends and Family:   . Frequency of Social Gatherings with Friends and Family:   . Attends Religious Services:   . Active Member of Clubs or Organizations:   . Attends Archivist Meetings:   Marland Kitchen Marital Status:   Intimate Partner Violence:   . Fear of Current or Ex-Partner:   . Emotionally  Abused:   Marland Kitchen Physically Abused:   . Sexually Abused:    Family History: History reviewed. No pertinent family history.  Review of Systems: Review of Systems  Constitutional: Negative.   Respiratory: Negative.   Cardiovascular: Negative.   Gastrointestinal: Negative.   Genitourinary: Negative.   Skin: Negative.   Neurological: Negative.    Physical Exam: Vital Signs BP 127/88 (BP Location: Left Arm, Patient Position: Sitting, Cuff Size: Large)   Pulse 74   Temp 98.4 F (36.9 C) (Temporal)   Ht 5' 3.5" (1.613 m)   Wt 214 lb (97.1 kg)  SpO2 98%   BMI 37.31 kg/m  Physical Exam Exam conducted with a chaperone present.  Constitutional:      General: She is not in acute distress.    Appearance: Normal appearance. She is not ill-appearing.  HENT:     Head: Normocephalic and atraumatic.  Eyes:     Pupils: Pupils are equal, round Neck:     Musculoskeletal: Normal range of motion.  Cardiovascular:     Rate and Rhythm: Normal rate and regular rhythm.     Pulses: Normal pulses.     Heart sounds: Normal heart sounds. No murmur.  Pulmonary:     Effort: Pulmonary effort is normal. No respiratory distress.     Breath sounds: Normal breath sounds. No wheezing.  Breast: No rashes, lesions noted. Abdominal:     General: Abdomen is flat. There is no distension.     Palpations: Abdomen is soft.     Tenderness: There is no abdominal tenderness.  Musculoskeletal: Normal range of motion.  Skin:    General: Skin is warm and dry.     Findings: No erythema or rash.  Neurological:     General: No focal deficit present.     Mental Status: She is alert and oriented to person, place, and time. Mental status is at baseline.     Motor: No weakness.  Psychiatric:        Mood and Affect: Mood normal.        Behavior: Behavior normal.    Assessment/Plan: The patient is scheduled for bilateral breast reduction with Dr. Arita Miss on 11/05/2019.  Risks, benefits, and alternatives of procedure  discussed, questions answered and consent obtained.     Mammogram: Mammogram in 2019, Normal. Normal biopsy 2019.  Smoking status: Non-smoker Caprini score: 5, high, BMI > 25, Surgery > 45 min, varicose veins, recommend mechanical ppx, early ambulation and postop chemoppx.  Patient is on chronic narcotics for chronic back pain, Percocet 5-325 prescribed by pain management.  Will send rx for 5 days of Percocet 5-325 as patient reports she cannot tolerated norco. She has zofran at home and does not need a rx. She understands to only take percocet prescribed by this office for post op pain control. She is in agreement with this plan.  The risk that can be encountered with breast reduction were discussed and include the following but not limited to these:  Breast asymmetry, fluid accumulation, firmness of the breast, inability to breast feed, loss of nipple or areola, skin loss, decrease or no nipple sensation, fat necrosis of the breast tissue, bleeding, infection, healing delay.  There are risks of anesthesia, changes to skin sensation and injury to nerves or blood vessels.  The muscle can be temporarily or permanently injured.  You may have an allergic reaction to tape, suture, glue, blood products which can result in skin discoloration, swelling, pain, skin lesions, poor healing.  Any of these can lead to the need for revisonal surgery or stage procedures.  A reduction has potential to interfere with diagnostic procedures.  Nipple or breast piercing can increase risks of infection.  This procedure is best done when the breast is fully developed.  Changes in the breast will continue to occur over time.  Pregnancy can alter the outcomes of previous breast reduction surgery, weight gain and weigh loss can also effect the long term appearance.   Patient provided with general consent form and bilateral breast reduction consent form covering surgical risks associated with general surgical intervention as well  as specifics in regards to bilateral breast reduction.  Patient was allocated adequate time prior to appointment to read through, initial and sign that they agree that they are aware of the risks associated. We also covered the risks in person during this pre-op appointment. I answered all of the patient's questions to their content and informed them to call with any further questions or concerns prior to surgery and I would be happy to discuss with them further.    Electronically signed by: Kermit Balo Helen Winterhalter, PA-C 10/29/2019 12:46 PM

## 2019-10-29 NOTE — Telephone Encounter (Signed)
[facility], called to confirm if we were aware that patient was on other narcotic drugs before they filled the prescription we sent in. Please call pharmacy to confirm if it's okay to go forward with filling prescription

## 2019-10-29 NOTE — H&P (View-Only) (Signed)
Patient ID: [name] [name], female    DOB: 12/03/1960, 59 y.o.   MRN: [id number]  Chief Complaint  Patient presents with  . Pre-op Exam      ICD-10-CM   1. Macromastia  N62     History of Present Illness: [name] [name] is a 59 y.o.  female  with a history of macromastia.  She presents for preoperative evaluation for upcoming procedure, bilateral breast reduction, scheduled for 11/05/2019 with Dr. Arita Miss. Patient has a history of chronic back pain that has been unrelieved with chronic pain control (opioids). She also has a history of intermittent rashes within the breast fold, limited success with cream and powder treatments  The patient has not had problems with anesthesia. No hx of dvt/pe. No fmhx of dvt/pe. She is currently taking aspirin daily. No bleeding or clotting disorders. No recent colds/illnesses.   Last mammogram 2019 with a biopsy that was normal. She has previously faxed the results to our office.  She has a past surgical history of hysterectomy, colonoscopy,multiple knee arthroscopies, exlap - tolerated these procedures without any post-operative anesthetic complications.  Patient has a past medical history of diabetes (most recent A1c 7), asthma, chronic back pain, history of tachycardia, hypercholesterolemia, fibromyalgia, GERD, hypertension.   Estimated 800 g removal from each breast.  Past Medical History: Allergies: Allergies  Allergen Reactions  . Eggs Or Egg-Derived Products Anaphylaxis  . Tea Anaphylaxis  . Azithromycin Hives  . Celebrex [Celecoxib] Nausea Only    Stomach upset  . Contrast Media [Iodinated Diagnostic Agents] Nausea And Vomiting  . Flax Seed [Bio-Flax] Hives  . Grapeseed Extract [Nutritional Supplements] Hives  . Lisinopril Hives  . Morphine And Related Other (See Comments)    Doesn't help pain  . Other Hives    [place] Beans, strawberries, chicken embryo derived  . Paroxetine Other (See Comments)    Hallucinations  . Shellfish  Allergy Hives  . Tramadol Hives  . Vicodin [Hydrocodone-Acetaminophen] Hives and Itching    Current Medications:  Current Outpatient Medications:  .  aspirin EC 81 MG tablet, Take 81 mg by mouth daily., Disp: , Rfl:  .  metFORMIN (GLUCOPHAGE) 1000 MG tablet, Take 1,000 mg by mouth 2 (two) times daily with a meal., Disp: , Rfl:  .  potassium chloride (KLOR-CON) 10 MEQ tablet, Take 10 mEq by mouth daily., Disp: , Rfl:  .  albuterol (PROAIR HFA) 108 (90 BASE) MCG/ACT inhaler, Inhale 2 puffs into the lungs every 4 (four) hours as needed for wheezing or shortness of breath., Disp: , Rfl:  .  atorvastatin (LIPITOR) 80 MG tablet, Take 80 mg by mouth daily., Disp: , Rfl: 11 .  EPINEPHrine (EPIPEN 2-PAK) 0.3 mg/0.3 mL IJ SOAJ injection, Inject 0.3 mg into the muscle once., Disp: , Rfl:  .  esomeprazole (NEXIUM) 40 MG capsule, TAKE ONE CAPSULE ONCE DAILY, Disp: 30 capsule, Rfl: 0 .  irbesartan-hydrochlorothiazide (AVALIDE) 300-12.5 MG tablet, Take 1 tablet by mouth daily., Disp: , Rfl: 11 .  loratadine (CLARITIN) 10 MG tablet, Take 1 tablet (10 mg total) by mouth daily as needed for allergies., Disp: 30 tablet, Rfl: 0 .  methocarbamol (ROBAXIN) 500 MG tablet, Take 1 tablet (500 mg total) by mouth 3 (three) times daily as needed for muscle spasms., Disp: 60 tablet, Rfl: 0 .  montelukast (SINGULAIR) 10 MG tablet, Take 1 tablet (10 mg total) by mouth daily., Disp: 30 tablet, Rfl: 0 .  oxyCODONE-acetaminophen (PERCOCET) 10-325 MG tablet, Take 1 tablet  by mouth every 4 (four) hours as needed for pain., Disp: 60 tablet, Rfl: 0 .  ranitidine (ZANTAC) 300 MG tablet, Take 1 tablet (300 mg total) by mouth at bedtime., Disp: 30 tablet, Rfl: 0 .  sertraline (ZOLOFT) 25 MG tablet, Take 25 mg by mouth daily., Disp: , Rfl: 11 .  VICTOZA 18 MG/3ML SOPN, Inject 1.8 mg into the skin daily., Disp: , Rfl: 3 .  WELCHOL 625 MG tablet, Take 975 mg by mouth 2 (two) times daily., Disp: , Rfl: 3  Past Medical Problems: Past  Medical History:  Diagnosis Date  . Allergy   . Asthma   . Chronic back pain   . Depression   . Diabetes mellitus without complication ([place])    type 2  . Dysrhythmia    hisotry of tachycardia  . Fibromyalgia   . GERD (gastroesophageal reflux disease)   . Headache   . Hypercholesteremia   . Hypertension   . Sickle cell trait [facility])     Past Surgical History: Past Surgical History:  Procedure Laterality Date  . ABDOMINAL HYSTERECTOMY    . CESAREAN SECTION    . COLONOSCOPY  2017  . EXPLORATORY LAPAROTOMY    . KNEE ARTHROSCOPY     2 on left, one on right    Social History: Social History   Socioeconomic History  . Marital status: Single    Spouse name: Not on file  . Number of children: Not on file  . Years of education: Not on file  . Highest education level: Not on file  Occupational History  . Not on file  Tobacco Use  . Smoking status: Never Smoker  . Smokeless tobacco: Never Used  Substance and Sexual Activity  . Alcohol use: No  . Drug use: No  . Sexual activity: Not on file  Other Topics Concern  . Not on file  Social History Narrative  . Not on file   Social Determinants of Health   Financial Resource Strain:   . Difficulty of Paying Living Expenses:   Food Insecurity:   . Worried About Charity fundraiser in the Last Year:   . Arboriculturist in the Last Year:   Transportation Needs:   . Film/video editor (Medical):   Marland Kitchen Lack of Transportation (Non-Medical):   Physical Activity:   . Days of Exercise per Week:   . Minutes of Exercise per Session:   Stress:   . Feeling of Stress :   Social Connections:   . Frequency of Communication with Friends and Family:   . Frequency of Social Gatherings with Friends and Family:   . Attends Religious Services:   . Active Member of Clubs or Organizations:   . Attends Archivist Meetings:   Marland Kitchen Marital Status:   Intimate Partner Violence:   . Fear of Current or Ex-Partner:   . Emotionally  Abused:   Marland Kitchen Physically Abused:   . Sexually Abused:    Family History: History reviewed. No pertinent family history.  Review of Systems: Review of Systems  Constitutional: Negative.   Respiratory: Negative.   Cardiovascular: Negative.   Gastrointestinal: Negative.   Genitourinary: Negative.   Skin: Negative.   Neurological: Negative.    Physical Exam: Vital Signs BP 127/88 (BP Location: Left Arm, Patient Position: Sitting, Cuff Size: Large)   Pulse 74   Temp 98.4 F (36.9 C) (Temporal)   Ht 5' 3.5" (1.613 m)   Wt 214 lb (97.1 kg)  SpO2 98%   BMI 37.31 kg/m  Physical Exam Exam conducted with a chaperone present.  Constitutional:      General: She is not in acute distress.    Appearance: Normal appearance. She is not ill-appearing.  HENT:     Head: Normocephalic and atraumatic.  Eyes:     Pupils: Pupils are equal, round Neck:     Musculoskeletal: Normal range of motion.  Cardiovascular:     Rate and Rhythm: Normal rate and regular rhythm.     Pulses: Normal pulses.     Heart sounds: Normal heart sounds. No murmur.  Pulmonary:     Effort: Pulmonary effort is normal. No respiratory distress.     Breath sounds: Normal breath sounds. No wheezing.  Breast: No rashes, lesions noted. Abdominal:     General: Abdomen is flat. There is no distension.     Palpations: Abdomen is soft.     Tenderness: There is no abdominal tenderness.  Musculoskeletal: Normal range of motion.  Skin:    General: Skin is warm and dry.     Findings: No erythema or rash.  Neurological:     General: No focal deficit present.     Mental Status: She is alert and oriented to person, place, and time. Mental status is at baseline.     Motor: No weakness.  Psychiatric:        Mood and Affect: Mood normal.        Behavior: Behavior normal.    Assessment/Plan: The patient is scheduled for bilateral breast reduction with Dr. Arita Miss on 11/05/2019.  Risks, benefits, and alternatives of procedure  discussed, questions answered and consent obtained.     Mammogram: Mammogram in 2019, Normal. Normal biopsy 2019.  Smoking status: Non-smoker Caprini score: 5, high, BMI > 25, Surgery > 45 min, varicose veins, recommend mechanical ppx, early ambulation and postop chemoppx.  Patient is on chronic narcotics for chronic back pain, Percocet 5-325 prescribed by pain management.  Will send rx for 5 days of Percocet 5-325 as patient reports she cannot tolerated norco. She has zofran at home and does not need a rx. She understands to only take percocet prescribed by this office for post op pain control. She is in agreement with this plan.  The risk that can be encountered with breast reduction were discussed and include the following but not limited to these:  Breast asymmetry, fluid accumulation, firmness of the breast, inability to breast feed, loss of nipple or areola, skin loss, decrease or no nipple sensation, fat necrosis of the breast tissue, bleeding, infection, healing delay.  There are risks of anesthesia, changes to skin sensation and injury to nerves or blood vessels.  The muscle can be temporarily or permanently injured.  You may have an allergic reaction to tape, suture, glue, blood products which can result in skin discoloration, swelling, pain, skin lesions, poor healing.  Any of these can lead to the need for revisonal surgery or stage procedures.  A reduction has potential to interfere with diagnostic procedures.  Nipple or breast piercing can increase risks of infection.  This procedure is best done when the breast is fully developed.  Changes in the breast will continue to occur over time.  Pregnancy can alter the outcomes of previous breast reduction surgery, weight gain and weigh loss can also effect the long term appearance.   Patient provided with general consent form and bilateral breast reduction consent form covering surgical risks associated with general surgical intervention as well  as specifics in regards to bilateral breast reduction.  Patient was allocated adequate time prior to appointment to read through, initial and sign that they agree that they are aware of the risks associated. We also covered the risks in person during this pre-op appointment. I answered all of the patient's questions to their content and informed them to call with any further questions or concerns prior to surgery and I would be happy to discuss with them further.    Electronically signed by: Lumir Demetriou J Glendy Barsanti, PA-C 10/29/2019 12:46 PM 

## 2019-10-30 ENCOUNTER — Other Ambulatory Visit: Payer: Self-pay

## 2019-10-30 ENCOUNTER — Encounter (HOSPITAL_BASED_OUTPATIENT_CLINIC_OR_DEPARTMENT_OTHER): Payer: Self-pay | Admitting: Plastic Surgery

## 2019-11-01 ENCOUNTER — Other Ambulatory Visit (HOSPITAL_COMMUNITY)
Admission: RE | Admit: 2019-11-01 | Discharge: 2019-11-01 | Disposition: A | Discharge status: Home / Self Care | Payer: Medicare Other | Source: Ambulatory Visit | Attending: Plastic Surgery | Admitting: Plastic Surgery

## 2019-11-01 ENCOUNTER — Encounter (HOSPITAL_BASED_OUTPATIENT_CLINIC_OR_DEPARTMENT_OTHER)
Admission: RE | Admit: 2019-11-01 | Discharge: 2019-11-01 | Disposition: A | Discharge status: Home / Self Care | Payer: Medicare Other | Source: Ambulatory Visit | Attending: Plastic Surgery | Admitting: Plastic Surgery

## 2019-11-01 DIAGNOSIS — Z20822 Contact with and (suspected) exposure to covid-19: Secondary | ICD-10-CM | POA: Diagnosis not present

## 2019-11-01 DIAGNOSIS — Z01812 Encounter for preprocedural laboratory examination: Secondary | ICD-10-CM | POA: Diagnosis present

## 2019-11-01 LAB — BASIC METABOLIC PANEL
Anion gap: 10 (ref 5–15)
BUN: 15 mg/dL (ref 6–20)
CO2: 27 mmol/L (ref 22–32)
Calcium: 9.4 mg/dL (ref 8.9–10.3)
Chloride: 98 mmol/L (ref 98–111)
Creatinine, Ser: 0.97 mg/dL (ref 0.44–1.00)
GFR calc Af Amer: >60 mL/min (ref >60)
GFR calc non Af Amer: >60 mL/min (ref >60)
Glucose, Bld: 106 mg/dL — ABNORMAL HIGH (ref 70–99)
Potassium: 3.9 mmol/L (ref 3.5–5.1)
Sodium: 135 mmol/L (ref 135–145)

## 2019-11-01 LAB — SARS CORONAVIRUS 2 (TAT 6-24 HRS): SARS Coronavirus 2: NEGATIVE

## 2019-11-05 ENCOUNTER — Ambulatory Visit (HOSPITAL_BASED_OUTPATIENT_CLINIC_OR_DEPARTMENT_OTHER): Payer: Medicare Other | Admitting: Anesthesiology

## 2019-11-05 ENCOUNTER — Other Ambulatory Visit: Payer: Self-pay

## 2019-11-05 ENCOUNTER — Ambulatory Visit (HOSPITAL_BASED_OUTPATIENT_CLINIC_OR_DEPARTMENT_OTHER)
Admission: RE | Admit: 2019-11-05 | Discharge: 2019-11-05 | Disposition: A | Discharge status: Home / Self Care | Payer: Medicare Other | Source: Ambulatory Visit | Attending: Plastic Surgery | Admitting: Plastic Surgery

## 2019-11-05 ENCOUNTER — Encounter (HOSPITAL_BASED_OUTPATIENT_CLINIC_OR_DEPARTMENT_OTHER)
Admission: RE | Disposition: A | Discharge status: Home / Self Care | Payer: Self-pay | Source: Ambulatory Visit | Attending: Plastic Surgery

## 2019-11-05 ENCOUNTER — Encounter (HOSPITAL_BASED_OUTPATIENT_CLINIC_OR_DEPARTMENT_OTHER): Payer: Self-pay | Admitting: Plastic Surgery

## 2019-11-05 DIAGNOSIS — E7800 Pure hypercholesterolemia, unspecified: Secondary | ICD-10-CM | POA: Insufficient documentation

## 2019-11-05 DIAGNOSIS — F329 Major depressive disorder, single episode, unspecified: Secondary | ICD-10-CM | POA: Diagnosis not present

## 2019-11-05 DIAGNOSIS — K219 Gastro-esophageal reflux disease without esophagitis: Secondary | ICD-10-CM | POA: Diagnosis not present

## 2019-11-05 DIAGNOSIS — Z79899 Other long term (current) drug therapy: Secondary | ICD-10-CM | POA: Diagnosis not present

## 2019-11-05 DIAGNOSIS — N62 Hypertrophy of breast: Secondary | ICD-10-CM | POA: Diagnosis not present

## 2019-11-05 DIAGNOSIS — D573 Sickle-cell trait: Secondary | ICD-10-CM | POA: Diagnosis not present

## 2019-11-05 DIAGNOSIS — I10 Essential (primary) hypertension: Secondary | ICD-10-CM | POA: Diagnosis not present

## 2019-11-05 DIAGNOSIS — Z7984 Long term (current) use of oral hypoglycemic drugs: Secondary | ICD-10-CM | POA: Insufficient documentation

## 2019-11-05 DIAGNOSIS — E119 Type 2 diabetes mellitus without complications: Secondary | ICD-10-CM | POA: Insufficient documentation

## 2019-11-05 DIAGNOSIS — Z7982 Long term (current) use of aspirin: Secondary | ICD-10-CM | POA: Insufficient documentation

## 2019-11-05 DIAGNOSIS — G8929 Other chronic pain: Secondary | ICD-10-CM | POA: Diagnosis not present

## 2019-11-05 DIAGNOSIS — M549 Dorsalgia, unspecified: Secondary | ICD-10-CM | POA: Diagnosis not present

## 2019-11-05 DIAGNOSIS — J45909 Unspecified asthma, uncomplicated: Secondary | ICD-10-CM | POA: Diagnosis not present

## 2019-11-05 HISTORY — PX: BREAST REDUCTION SURGERY: SHX8

## 2019-11-05 LAB — GLUCOSE, CAPILLARY
Glucose-Capillary: 92 mg/dL (ref 70–99)
Glucose-Capillary: 117 mg/dL — ABNORMAL HIGH (ref 70–99)

## 2019-11-05 SURGERY — MAMMOPLASTY, REDUCTION
Anesthesia: General | Site: Breast | Laterality: Bilateral

## 2019-11-05 MED ORDER — EPHEDRINE SULFATE 50 MG/ML IJ SOLN
INTRAMUSCULAR | Status: DC | PRN
  Administered 2019-11-05 (×2): 10 mg via INTRAVENOUS
  Administered 2019-11-05 (×2): 5 mg via INTRAVENOUS

## 2019-11-05 MED ORDER — HYDROMORPHONE HCL 1 MG/ML IJ SOLN
0.2500 mg | INTRAMUSCULAR | Status: DC | PRN
  Administered 2019-11-05: 0.25 mg via INTRAVENOUS

## 2019-11-05 MED ORDER — OXYCODONE HCL 5 MG PO TABS
5.0000 mg | ORAL_TABLET | Freq: Once | ORAL | Status: AC | PRN
  Administered 2019-11-05: 5 mg via ORAL

## 2019-11-05 MED ORDER — BUPIVACAINE HCL (PF) 0.25 % IJ SOLN
INTRAMUSCULAR | Status: AC
  Filled 2019-11-05: qty 30

## 2019-11-05 MED ORDER — ROCURONIUM BROMIDE 100 MG/10ML IV SOLN
INTRAVENOUS | Status: DC | PRN
  Administered 2019-11-05: 60 mg via INTRAVENOUS

## 2019-11-05 MED ORDER — OXYCODONE HCL 5 MG/5ML PO SOLN: 5.0000 mg | Freq: Once | ORAL | Status: AC | PRN

## 2019-11-05 MED ORDER — OXYCODONE HCL 5 MG PO TABS
ORAL_TABLET | ORAL | Status: AC
  Filled 2019-11-05: qty 1

## 2019-11-05 MED ORDER — DEXAMETHASONE SODIUM PHOSPHATE 10 MG/ML IJ SOLN
INTRAMUSCULAR | Status: DC | PRN
  Administered 2019-11-05: 10 mg via INTRAVENOUS

## 2019-11-05 MED ORDER — LIDOCAINE 2% (20 MG/ML) 5 ML SYRINGE
INTRAMUSCULAR | Status: DC | PRN
  Administered 2019-11-05: 100 mg via INTRAVENOUS

## 2019-11-05 MED ORDER — LIDOCAINE 2% (20 MG/ML) 5 ML SYRINGE
INTRAMUSCULAR | Status: AC
  Filled 2019-11-05: qty 10

## 2019-11-05 MED ORDER — MIDAZOLAM HCL 2 MG/2ML IJ SOLN
INTRAMUSCULAR | Status: AC
  Filled 2019-11-05: qty 2

## 2019-11-05 MED ORDER — PROPOFOL 500 MG/50ML IV EMUL
INTRAVENOUS | Status: AC
  Filled 2019-11-05: qty 50

## 2019-11-05 MED ORDER — ONDANSETRON HCL 4 MG/2ML IJ SOLN
INTRAMUSCULAR | Status: DC | PRN
  Administered 2019-11-05: 4 mg via INTRAVENOUS

## 2019-11-05 MED ORDER — PROPOFOL 10 MG/ML IV BOLUS
INTRAVENOUS | Status: DC | PRN
  Administered 2019-11-05: 180 mg via INTRAVENOUS
  Administered 2019-11-05: 20 mg via INTRAVENOUS

## 2019-11-05 MED ORDER — LACTATED RINGERS IV SOLN: INTRAVENOUS | Status: DC | PRN

## 2019-11-05 MED ORDER — PROMETHAZINE HCL 25 MG/ML IJ SOLN: 6.2500 mg | INTRAMUSCULAR | Status: DC | PRN

## 2019-11-05 MED ORDER — LIDOCAINE-EPINEPHRINE 1 %-1:100000 IJ SOLN
INTRAMUSCULAR | Status: AC
  Filled 2019-11-05: qty 1

## 2019-11-05 MED ORDER — PHENYLEPHRINE HCL (PRESSORS) 10 MG/ML IV SOLN
INTRAVENOUS | Status: DC | PRN
  Administered 2019-11-05 (×4): 80 ug via INTRAVENOUS

## 2019-11-05 MED ORDER — MIDAZOLAM HCL 5 MG/5ML IJ SOLN
INTRAMUSCULAR | Status: DC | PRN
  Administered 2019-11-05: 2 mg via INTRAVENOUS

## 2019-11-05 MED ORDER — CEFAZOLIN SODIUM-DEXTROSE 1-4 GM/50ML-% IV SOLN
INTRAVENOUS | Status: DC | PRN
  Administered 2019-11-05: 2 g via INTRAVENOUS

## 2019-11-05 MED ORDER — ROCURONIUM BROMIDE 10 MG/ML (PF) SYRINGE
PREFILLED_SYRINGE | INTRAVENOUS | Status: AC
  Filled 2019-11-05: qty 10

## 2019-11-05 MED ORDER — BACITRACIN ZINC 500 UNIT/GM EX OINT
TOPICAL_OINTMENT | CUTANEOUS | Status: AC
  Filled 2019-11-05: qty 0.9

## 2019-11-05 MED ORDER — FENTANYL CITRATE (PF) 100 MCG/2ML IJ SOLN
INTRAMUSCULAR | Status: AC
  Filled 2019-11-05: qty 2

## 2019-11-05 MED ORDER — LACTATED RINGERS IV SOLN
INTRAVENOUS | Status: DC | PRN
  Administered 2019-11-05: 700 mL

## 2019-11-05 MED ORDER — FENTANYL CITRATE (PF) 100 MCG/2ML IJ SOLN
INTRAMUSCULAR | Status: DC | PRN
  Administered 2019-11-05: 100 ug via INTRAVENOUS
  Administered 2019-11-05 (×2): 50 ug via INTRAVENOUS

## 2019-11-05 MED ORDER — CEFAZOLIN SODIUM-DEXTROSE 2-4 GM/100ML-% IV SOLN
INTRAVENOUS | Status: AC
  Filled 2019-11-05: qty 100

## 2019-11-05 MED ORDER — CEFAZOLIN SODIUM-DEXTROSE 2-4 GM/100ML-% IV SOLN: 2.0000 g | INTRAVENOUS | Status: DC

## 2019-11-05 MED ORDER — CEFAZOLIN SODIUM 1 G IJ SOLR
INTRAMUSCULAR | Status: AC
  Filled 2019-11-05: qty 10

## 2019-11-05 MED ORDER — DEXAMETHASONE SODIUM PHOSPHATE 10 MG/ML IJ SOLN
INTRAMUSCULAR | Status: AC
  Filled 2019-11-05: qty 1

## 2019-11-05 MED ORDER — EPHEDRINE 5 MG/ML INJ
INTRAVENOUS | Status: AC
  Filled 2019-11-05: qty 20

## 2019-11-05 MED ORDER — ACETAMINOPHEN 500 MG PO TABS: 1000.0000 mg | ORAL_TABLET | Freq: Once | ORAL | Status: DC

## 2019-11-05 MED ORDER — ONDANSETRON HCL 4 MG/2ML IJ SOLN
INTRAMUSCULAR | Status: AC
  Filled 2019-11-05: qty 4

## 2019-11-05 MED ORDER — LACTATED RINGERS IV SOLN: INTRAVENOUS | Status: DC

## 2019-11-05 MED ORDER — PHENYLEPHRINE 40 MCG/ML (10ML) SYRINGE FOR IV PUSH (FOR BLOOD PRESSURE SUPPORT)
PREFILLED_SYRINGE | INTRAVENOUS | Status: AC
  Filled 2019-11-05: qty 20

## 2019-11-05 MED ORDER — EPINEPHRINE PF 1 MG/ML IJ SOLN
INTRAMUSCULAR | Status: AC
  Filled 2019-11-05: qty 1

## 2019-11-05 MED ORDER — SUCCINYLCHOLINE CHLORIDE 200 MG/10ML IV SOSY
PREFILLED_SYRINGE | INTRAVENOUS | Status: AC
  Filled 2019-11-05: qty 10

## 2019-11-05 MED ORDER — HYDROMORPHONE HCL 1 MG/ML IJ SOLN
INTRAMUSCULAR | Status: AC
  Filled 2019-11-05: qty 0.5

## 2019-11-05 SURGICAL SUPPLY — 70 items
BAG DECANTER FOR FLEXI CONT (MISCELLANEOUS) ×3 IMPLANT
BENZOIN TINCTURE PRP APPL 2/3 (GAUZE/BANDAGES/DRESSINGS) ×6 IMPLANT
BLADE SURG 10 STRL SS (BLADE) ×6 IMPLANT
BLADE SURG 15 STRL LF DISP TIS (BLADE) IMPLANT
BLADE SURG 15 STRL SS (BLADE) ×3
BNDG ELASTIC 6X5.8 VLCR STR LF (GAUZE/BANDAGES/DRESSINGS) ×3 IMPLANT
BNDG GAUZE ELAST 4 BULKY (GAUZE/BANDAGES/DRESSINGS) ×6 IMPLANT
CANISTER SUCT 1200ML W/VALVE (MISCELLANEOUS) ×3 IMPLANT
CHLORAPREP W/TINT 26 (MISCELLANEOUS) ×6 IMPLANT
CLIP VESOCCLUDE MED 6/CT (CLIP) IMPLANT
COVER BACK TABLE 60X90IN (DRAPES) ×3 IMPLANT
COVER MAYO STAND STRL (DRAPES) ×3 IMPLANT
COVER WAND RF STERILE (DRAPES) IMPLANT
DECANTER SPIKE VIAL GLASS SM (MISCELLANEOUS) IMPLANT
DRAIN CHANNEL 15F RND FF W/TCR (WOUND CARE) IMPLANT
DRAPE LAPAROSCOPIC ABDOMINAL (DRAPES) ×3 IMPLANT
DRAPE UTILITY XL STRL (DRAPES) ×3 IMPLANT
DRSG PAD ABDOMINAL 8X10 ST (GAUZE/BANDAGES/DRESSINGS) ×10 IMPLANT
ELECT REM PT RETURN 9FT ADLT (ELECTROSURGICAL) ×3
ELECTRODE REM PT RTRN 9FT ADLT (ELECTROSURGICAL) ×1 IMPLANT
EVACUATOR SILICONE 100CC (DRAIN) IMPLANT
GAUZE SPONGE 4X4 12PLY STRL (GAUZE/BANDAGES/DRESSINGS) ×6 IMPLANT
GLOVE BIO SURGEON STRL SZ 6.5 (GLOVE) ×2 IMPLANT
GLOVE BIO SURGEON STRL SZ7.5 (GLOVE) IMPLANT
GLOVE BIO SURGEONS STRL SZ 6.5 (GLOVE) ×2
GLOVE BIOGEL M STRL SZ7.5 (GLOVE) ×3 IMPLANT
GLOVE BIOGEL PI IND STRL 6.5 (GLOVE) IMPLANT
GLOVE BIOGEL PI IND STRL 8 (GLOVE) IMPLANT
GLOVE BIOGEL PI INDICATOR 6.5 (GLOVE) ×6
GLOVE BIOGEL PI INDICATOR 8 (GLOVE) ×2
GLOVE ECLIPSE 6.5 STRL STRAW (GLOVE) ×2 IMPLANT
GOWN STRL REUS W/ TWL LRG LVL3 (GOWN DISPOSABLE) ×2 IMPLANT
GOWN STRL REUS W/TWL LRG LVL3 (GOWN DISPOSABLE) ×6
MARKER SKIN DUAL TIP RULER LAB (MISCELLANEOUS) ×2 IMPLANT
NDL FILTER BLUNT 18X1 1/2 (NEEDLE) ×1 IMPLANT
NDL HYPO 25X1 1.5 SAFETY (NEEDLE) IMPLANT
NDL SAFETY ECLIPSE 18X1.5 (NEEDLE) ×1 IMPLANT
NDL SPNL 18GX3.5 QUINCKE PK (NEEDLE) ×1 IMPLANT
NEEDLE FILTER BLUNT 18X 1/2SAF (NEEDLE) ×2
NEEDLE FILTER BLUNT 18X1 1/2 (NEEDLE) ×1 IMPLANT
NEEDLE HYPO 18GX1.5 SHARP (NEEDLE)
NEEDLE HYPO 25X1 1.5 SAFETY (NEEDLE) IMPLANT
NEEDLE SPNL 18GX3.5 QUINCKE PK (NEEDLE) ×3 IMPLANT
NS IRRIG 1000ML POUR BTL (IV SOLUTION) ×3 IMPLANT
PACK BASIN DAY SURGERY FS (CUSTOM PROCEDURE TRAY) ×3 IMPLANT
PENCIL SMOKE EVACUATOR (MISCELLANEOUS) ×3 IMPLANT
PIN SAFETY STERILE (MISCELLANEOUS) IMPLANT
SHEET MEDIUM DRAPE 40X70 STRL (DRAPES) ×2 IMPLANT
SLEEVE SCD COMPRESS KNEE MED (MISCELLANEOUS) ×3 IMPLANT
SPONGE LAP 18X18 RF (DISPOSABLE) ×11 IMPLANT
STAPLER INSORB 30 2030 C-SECTI (MISCELLANEOUS) ×5 IMPLANT
STAPLER VISISTAT 35W (STAPLE) ×5 IMPLANT
STRIP SUTURE WOUND CLOSURE 1/2 (MISCELLANEOUS) ×9 IMPLANT
SUT ETHILON 2 0 FS 18 (SUTURE) IMPLANT
SUT MNCRL AB 4-0 PS2 18 (SUTURE) ×8 IMPLANT
SUT PDS 3-0 CT2 (SUTURE) ×9
SUT PDS II 3-0 CT2 27 ABS (SUTURE) ×2 IMPLANT
SUT VIC AB 3-0 PS1 18 (SUTURE)
SUT VIC AB 3-0 PS1 18XBRD (SUTURE) IMPLANT
SUT VLOC 90 P-14 23 (SUTURE) ×8 IMPLANT
SYR 50ML LL SCALE MARK (SYRINGE) ×6 IMPLANT
SYR BULB IRRIGATION 50ML (SYRINGE) ×3 IMPLANT
SYR CONTROL 10ML LL (SYRINGE) IMPLANT
TAPE MEASURE VINYL STERILE (MISCELLANEOUS) IMPLANT
TOWEL GREEN STERILE FF (TOWEL DISPOSABLE) ×6 IMPLANT
TRAY FOLEY W/BAG SLVR 14FR LF (SET/KITS/TRAYS/PACK) IMPLANT
TUBE CONNECTING 20'X1/4 (TUBING) ×1
TUBE CONNECTING 20X1/4 (TUBING) ×2 IMPLANT
UNDERPAD 30X36 HEAVY ABSORB (UNDERPADS AND DIAPERS) ×6 IMPLANT
YANKAUER SUCT BULB TIP NO VENT (SUCTIONS) ×3 IMPLANT

## 2019-11-05 NOTE — Brief Op Note (Signed)
11/05/2019  10:43 AM  PATIENT:  [name] [name]  59 y.o. female  PRE-OPERATIVE DIAGNOSIS:  macromastia  POST-OPERATIVE DIAGNOSIS:  macromastia  PROCEDURE:  Procedure(s) with comments: MAMMARY REDUCTION  (BREAST) (Bilateral) - 3.5 hours, please  SURGEON:  Surgeon(s) and Role:    * Shamari Lofquist, Wendy Poet, MD - Primary  PHYSICIAN ASSISTANT: Joni Fears, PA  ASSISTANTS: none   ANESTHESIA:   general  EBL:  25 mL   BLOOD ADMINISTERED:none  DRAINS: none   LOCAL MEDICATIONS USED:  MARCAINE     SPECIMEN:  Source of Specimen:  r and l breast tissue  DISPOSITION OF SPECIMEN:  PATHOLOGY  COUNTS:  YES  TOURNIQUET:  * No tourniquets in log *  DICTATION: .Dragon Dictation  PLAN OF CARE: Discharge to home after PACU  PATIENT DISPOSITION:  PACU - hemodynamically stable.   Delay start of Pharmacological VTE agent (>24hrs) due to surgical blood loss or risk of bleeding: not applicable

## 2019-11-05 NOTE — Anesthesia Postprocedure Evaluation (Signed)
Anesthesia Post Note  Patient: [name] [name]  Procedure(s) Performed: MAMMARY REDUCTION  (BREAST) (Bilateral Breast)     Patient location during evaluation: PACU Anesthesia Type: General Level of consciousness: awake and alert and oriented Pain management: pain level controlled Vital Signs Assessment: post-procedure vital signs reviewed and stable Respiratory status: spontaneous breathing, nonlabored ventilation and respiratory function stable Cardiovascular status: blood pressure returned to baseline Postop Assessment: no apparent nausea or vomiting Anesthetic complications: no    Last Vitals:  Vitals:   11/05/19 1145 11/05/19 1200  BP: 129/86   Pulse: 95   Resp: 20   Temp:    SpO2: 98% 100%    Last Pain:  Vitals:   11/05/19 1200  TempSrc:   PainSc: 7                  Kaylyn Layer

## 2019-11-05 NOTE — Transfer of Care (Signed)
Immediate Anesthesia Transfer of Care Note  Patient: [name] [name]  Procedure(s) Performed: MAMMARY REDUCTION  (BREAST) (Bilateral Breast)  Patient Location: PACU  Anesthesia Type:General  Level of Consciousness: awake, alert  and oriented  Airway & Oxygen Therapy: Patient Spontanous Breathing and Patient connected to face mask oxygen  Post-op Assessment: Report given to RN and Post -op Vital signs reviewed and stable  Post vital signs: Reviewed and stable  Last Vitals:  Vitals Value Taken Time  BP 132/78 11/05/19 1054  Temp    Pulse 88 11/05/19 1058  Resp 23 11/05/19 1058  SpO2 100 % 11/05/19 1058  Vitals shown include unvalidated device data.  Last Pain:  Vitals:   11/05/19 0657  TempSrc: Oral  PainSc: 0-No pain         Complications: No apparent anesthesia complications

## 2019-11-05 NOTE — Anesthesia Procedure Notes (Signed)
Procedure Name: Intubation Date/Time: 11/05/2019 7:57 AM Performed by: Lavonia Dana, CRNA Pre-anesthesia Checklist: Patient identified, Emergency Drugs available, Suction available and Patient being monitored Patient Re-evaluated:Patient Re-evaluated prior to induction Oxygen Delivery Method: Circle system utilized Preoxygenation: Pre-oxygenation with 100% oxygen Induction Type: IV induction Ventilation: Mask ventilation without difficulty Laryngoscope Size: Mac and 4 Grade View: Grade I Tube type: Oral Tube size: 7.0 mm Number of attempts: 1 Airway Equipment and Method: Stylet Placement Confirmation: ETT inserted through vocal cords under direct vision,  positive ETCO2 and breath sounds checked- equal and bilateral Secured at: 22 cm Tube secured with: Tape Dental Injury: Teeth and Oropharynx as per pre-operative assessment

## 2019-11-05 NOTE — Interval H&P Note (Signed)
History and Physical Interval Note:  11/05/2019 7:42 AM  [name] [name]  has presented today for surgery, with the diagnosis of macromastia.  The various methods of treatment have been discussed with the patient and family. After consideration of risks, benefits and other options for treatment, the patient has consented to  Procedure(s) with comments: MAMMARY REDUCTION  (BREAST) (Bilateral) - 3.5 hours, please as a surgical intervention.  The patient's history has been reviewed, patient examined, no change in status, stable for surgery.  I have reviewed the patient's chart and labs.  Questions were answered to the patient's satisfaction.     [name] [name]

## 2019-11-05 NOTE — Op Note (Signed)
Operative Note   DATE OF OPERATION: 11/05/2019  LOCATION: [facility]   SURGICAL DEPARTMENT: Plastic Surgery  PREOPERATIVE DIAGNOSES: Bilateral symptomatic macromastia.  POSTOPERATIVE DIAGNOSES:  same  PROCEDURE: Bilateral breast reduction with superomedial pedicle.  SURGEON: Ancil Linsey, MD  ASSISTANT: Joni Fears, PA The advanced practice practitioner (APP) assisted throughout the case.  The APP was essential in retraction and counter traction when needed to make the case progress smoothly.  This retraction and assistance made it possible to see the tissue plans for the procedure.  The assistance was needed for blood control, tissue re-approximation and assisted with closure of the incision site.  ANESTHESIA: General.  COMPLICATIONS: None.   INDICATIONS FOR PROCEDURE:  The patient, [name] [name] is a 59 y.o. female born on [birth year], is here for treatment of bilateral symptomatic macromastia. MRN: [id number]  CONSENT:  Informed consent was obtained directly from the patient. Risks, benefits and alternatives were fully discussed. Specific risks including but not limited to bleeding, infection, hematoma, seroma, scarring, pain, infection, contracture, asymmetry, wound healing problems, and need for further surgery were all discussed. The patient did have an ample opportunity to have questions answered to satisfaction.   DESCRIPTION OF PROCEDURE:  The patient was marked preoperatively for a Wise pattern skin excision.  The patient was taken to the operating room. SCDs were placed and antibiotics were given. General anesthesia was administered.The patient's operative site was prepped and draped in a sterile fashion. A time out was performed and all information was confirmed to be correct.  Right Breast: The breast was infiltrated with tumescent solution to help with hemostasis.  The nipple was marked with a cookie cutter.  A superomedial pedicle was drawn out  with the base of at least 8 cm in size.  A breast tourniquet was then applied and the pedicle was de-epithelialized.  Breast tourniquet was then let down and all incisions were made with a 10 blade.  The pedicle was then isolated down to the chest wall with cautery and the excision was performed removing tissue primarily inferiorly and laterally.  Hemostasis was obtained and the wound was stapled closed.  Left breast:  The breast was infiltrated with tumescent solution to help with hemostasis.  The nipple was marked with a cookie cutter.  A superomedial pedicle was drawn out with the base of at least 8 cm in size.  A breast tourniquet was then applied and the pedicle was de-epithelialized.  Breast tourniquet was then let down and all incisions were made with a 10 blade.  The pedicle was then isolated down to the chest wall with cautery and the excision was performed removing tissue primarily inferiorly and laterally.  Hemostasis was obtained and the wound was stapled closed.  Patient was then set up to check for size and symmetry.  Minor modifications were made.  This resulted in a total of 963g removed from the right side and 1017g removed from the left side.  The inframammary incision was closed with a combination of buried in-sorb staples and a running 3-0 Quill suture.  The vertical and periareolar limbs were closed with interrupted buried 4-0 Monocryl and a running 4-0 Quill suture.  Steri-Strips were then applied along with a soft dressing and Ace wrap.  The patient tolerated the procedure well.  There were no complications. The patient was allowed to wake from anesthesia, extubated and taken to the recovery room in satisfactory condition.  I was present for the entire procedure.

## 2019-11-05 NOTE — Anesthesia Preprocedure Evaluation (Addendum)
Anesthesia Evaluation    Reviewed: Allergy & Precautions, Patient's Chart, lab work & pertinent test results  History of Anesthesia Complications Negative for: history of anesthetic complications  Airway Mallampati: II  TM Distance: >3 FB Neck ROM: Full    Dental  (+) Partial Upper   Pulmonary asthma ,    Pulmonary exam normal        Cardiovascular hypertension, Pt. on medications Normal cardiovascular exam     Neuro/Psych  Headaches, Depression    GI/Hepatic Neg liver ROS, GERD  Medicated and Controlled,  Endo/Other  diabetes, Type 2, Oral Hypoglycemic Agents  Renal/GU negative Renal ROS  negative genitourinary   Musculoskeletal  (+) Fibromyalgia -  Abdominal   Peds  Hematology negative hematology ROS (+)   Anesthesia Other Findings macromastia  Reproductive/Obstetrics negative OB ROS                            Anesthesia Physical Anesthesia Plan  ASA: II  Anesthesia Plan: General   Post-op Pain Management:    Induction: Intravenous  PONV Risk Score and Plan: 4 or greater and Midazolam, Ondansetron, Dexamethasone and Treatment may vary due to age or medical condition  Airway Management Planned: Oral ETT  Additional Equipment: None  Intra-op Plan:   Post-operative Plan: Extubation in OR  Informed Consent: I have reviewed the patients History and Physical, chart, labs and discussed the procedure including the risks, benefits and alternatives for the proposed anesthesia with the patient or authorized representative who has indicated his/her understanding and acceptance.     Dental advisory given  Plan Discussed with: CRNA  Anesthesia Plan Comments:        Anesthesia Quick Evaluation

## 2019-11-06 LAB — SURGICAL PATHOLOGY

## 2019-11-06 NOTE — Discharge Instructions (Signed)
Activity As tolerated: NO showers for 3 days No heavy activities  Diet: Regular  Wound Care: Keep dressing clean & dry for 3 days.  Keep wrap applied with compression as much as possible 24/7.    Do not change dressings for 3 days unless soiled.  Can change if needed but make sure to reapply wrap. After three days can remove wrap and shower.  Then reapply dressings if needed and continue compression with wrap or soft sports bra.  Call doctor if any unusual problems occur such as pain, excessive bleeding, unrelieved nausea/vomiting, fever &/or chills  Follow-up appointment: Scheduled for 4/22.  Post Anesthesia Home Care Instructions  Activity: Get plenty of rest for the remainder of the day. A responsible individual must stay with you for 24 hours following the procedure.  For the next 24 hours, DO NOT: -Drive a car -Advertising copywriter -Drink alcoholic beverages -Take any medication unless instructed by your physician -Make any legal decisions or sign important papers.  Meals: Start with liquid foods such as gelatin or soup. Progress to regular foods as tolerated. Avoid greasy, spicy, heavy foods. If nausea and/or vomiting occur, drink only clear liquids until the nausea and/or vomiting subsides. Call your physician if vomiting continues.  Special Instructions/Symptoms: Your throat may feel dry or sore from the anesthesia or the breathing tube placed in your throat during surgery. If this causes discomfort, gargle with warm salt water. The discomfort should disappear within 24 hours.  If you had a scopolamine patch placed behind your ear for the management of post- operative nausea and/or vomiting:  1. The medication in the patch is effective for 72 hours, after which it should be removed.  Wrap patch in a tissue and discard in the trash. Wash hands thoroughly with soap and water. 2. You may remove the patch earlier than 72 hours if you experience unpleasant side effects which may  include dry mouth, dizziness or visual disturbances. 3. Avoid touching the patch. Wash your hands with soap and water after contact with the patch.

## 2019-11-08 ENCOUNTER — Encounter: Payer: Self-pay | Admitting: *Deleted

## 2019-11-14 ENCOUNTER — Other Ambulatory Visit: Payer: Self-pay

## 2019-11-14 ENCOUNTER — Encounter: Payer: Self-pay | Admitting: Plastic Surgery

## 2019-11-14 ENCOUNTER — Ambulatory Visit (INDEPENDENT_AMBULATORY_CARE_PROVIDER_SITE_OTHER): Payer: Medicare Other | Admitting: Plastic Surgery

## 2019-11-14 VITALS — BP 110/77 | HR 87 | Temp 97.1°F | Ht 64.0 in | Wt 214.0 lb

## 2019-11-14 DIAGNOSIS — N62 Hypertrophy of breast: Secondary | ICD-10-CM

## 2019-11-14 NOTE — Progress Notes (Signed)
Patient is here postop from bilateral breast reduction.  She is about a week out.  She has no real complaints other than mild soreness.  On exam everything looks fine.  The incisions are healing well and the nipple areolar complexes are viable.  There is no obvious subcutaneous fluid collections although she is mildly swollen on both sides.  We will plan to continue to avoid heavy activities and we will see her again in 3 to 4 weeks.  All her questions were answered.  I did reinforce the need for compressive garment to avoid further swelling or fluid collections.

## 2019-11-27 ENCOUNTER — Other Ambulatory Visit: Payer: Self-pay

## 2019-11-27 ENCOUNTER — Ambulatory Visit (INDEPENDENT_AMBULATORY_CARE_PROVIDER_SITE_OTHER): Payer: Medicare Other | Admitting: Plastic Surgery

## 2019-11-27 ENCOUNTER — Encounter: Payer: Self-pay | Admitting: Plastic Surgery

## 2019-11-27 VITALS — BP 128/84 | HR 77 | Temp 97.1°F | Ht 64.0 in | Wt 203.6 lb

## 2019-11-27 DIAGNOSIS — N62 Hypertrophy of breast: Secondary | ICD-10-CM

## 2019-11-27 NOTE — Progress Notes (Signed)
Patient comes in 3 weeks out from bilateral breast reduction.  She had been doing well with no issues but then she noticed some separation of her incision on the left side.  On exam she has dehiscence of the upper part of the vertical limb on the left side.  There is healthy granulation tissue at the base.  The nipple areolar complex is viable.  The remainder the incisions look fine.  She does not have any evidence of swelling or fluid collections on either side.  Plan to treat the dehiscence with dressing changes using Xeroform and gauze.  Plan to see her again in 2 weeks.

## 2019-11-28 ENCOUNTER — Telehealth: Payer: Self-pay

## 2019-11-28 NOTE — Telephone Encounter (Signed)
Orders faxed to [facility] for wound/dressing supplies per Joni Fears request/ Dr. Arita Miss copy scanned into chart

## 2019-12-05 ENCOUNTER — Ambulatory Visit: Payer: Medicare Other | Admitting: Plastic Surgery

## 2019-12-06 NOTE — Progress Notes (Signed)
Patient is a 59 yr-old female here for follow-up after bilateral breast reduction on 11/05/19 with Dr. Arita Miss. At last visit (5/5) she had dehiscence of the upper part of the vertical limb on the left side.   Left vertical limb is fully dehisced and some dehiscence of the incision around the NAC. NAC is viable. No signs of infection, redness, seroma/hematoma. The remainder of the incisions are healing very well, c/d/i. Denies F, CP, SOB, N/V.  Continue daily dressing changes of  Xeroform/gauze. Follow up in 2-3 weeks. Call office with any questions/concerns.     The 21st Century Cures Act was signed into law in 2016 which includes the topic of electronic health records.  This provides immediate access to information in MyChart.  This includes consultation notes, operative notes, office notes, lab results and pathology reports.  If you have any questions about what you read please let us know at your next visit or call us at the office.  We are right here with you.

## 2019-12-11 ENCOUNTER — Ambulatory Visit (INDEPENDENT_AMBULATORY_CARE_PROVIDER_SITE_OTHER): Payer: Medicare Other | Admitting: Plastic Surgery

## 2019-12-11 ENCOUNTER — Encounter: Payer: Self-pay | Admitting: Plastic Surgery

## 2019-12-11 ENCOUNTER — Other Ambulatory Visit: Payer: Self-pay

## 2019-12-11 DIAGNOSIS — Z9889 Other specified postprocedural states: Secondary | ICD-10-CM

## 2019-12-16 ENCOUNTER — Telehealth: Payer: Self-pay | Admitting: Plastic Surgery

## 2019-12-16 NOTE — Telephone Encounter (Signed)
Patient called to see if she could come by the office today to pick up a couple of the bandages ("with yellow stuff on the bandage") because she has run out. Please call her to advise if we have a couple on hand we can give her or if we can order her some more.

## 2019-12-17 NOTE — Telephone Encounter (Signed)
Called and [facility] @ 11:39am) asking the patient to RTC regarding message below.//AB/CMA

## 2019-12-17 NOTE — Telephone Encounter (Signed)
Spoke with the patient and informed her that we will not be able to give her any supplies, but I spoke with [facility] regarding her message, and she would like for her to try cutting the Xeroform in half.  If unable to do that she can use Vaseline and gauze.  Or she can call [facility] to see if they would send her more,and she can pay out of pocket for it.    Patient stated that she was not able to cut it in half.  She agreed to use the vaseline and gauze, and she stated that she will give [facility] a call regarding getting more supplies.//AB/CMA

## 2019-12-30 NOTE — Progress Notes (Signed)
Patient is a 58 year old female here for follow-up after bilateral breast reduction on 11/05/2019 with Dr. Arita Miss.  At her 5/5 visit she had dehiscence of the upper part of the vertical limb on the left side.  At her 5/19 visit the left vertical limb had fully dehisced with some dehiscence of the incision around the NAC.  ~ 8 weeks PO Today the wound shows good improvement.  Good granulation tissue formation.  Epithelialization is occurring on the outer edges so wound size is a bit smaller today.  Patient denies fever, nausea/vomiting.  No signs of infection, redness, seroma/hematoma.  A few suture knots were removed today  Continue daily dressing changes of Xeroform, gauze, ABD (obtained from [facility]).  Follow-up in 3 weeks.  Call office with any questions/concerns.

## 2020-01-01 ENCOUNTER — Ambulatory Visit (INDEPENDENT_AMBULATORY_CARE_PROVIDER_SITE_OTHER): Payer: Medicare Other | Admitting: Plastic Surgery

## 2020-01-01 ENCOUNTER — Encounter: Payer: Self-pay | Admitting: Plastic Surgery

## 2020-01-01 ENCOUNTER — Other Ambulatory Visit: Payer: Self-pay

## 2020-01-01 VITALS — BP 127/81 | HR 82 | Temp 97.6°F

## 2020-01-01 DIAGNOSIS — Z9889 Other specified postprocedural states: Secondary | ICD-10-CM

## 2020-01-21 NOTE — Progress Notes (Signed)
Patient is a 59 year old female here for follow-up after bilateral breast reduction on 11/05/2019 with Dr. Arita Miss.  The vertical limb incision and NAC had dehisced.  At her 01/01/2020 visit the wounds show good improvement with good granulation tissue formation and epithelialization occurring on the outer edges.  ~ 11 weeks PO Today patient reports she is doing well overall her wound is healing very nicely.  Good granulation tissue is formed and epithelialization has occurred over most of the wound.  No signs of infection, redness, drainage.  Continue to use Xeroform, gauze on the wound.  Follow-up in 3 weeks.  Call office with any questions/concerns.    Pictures were obtained of the patient and placed in the chart with the patient's or guardian's permission. The 21st Century Cures Act was signed into law in 2016 which includes the topic of electronic health records.  This provides immediate access to information in MyChart.  This includes consultation notes, operative notes, office notes, lab results and pathology reports.  If you have any questions about what you read please let us know at your next visit or call us at the office.  We are right here with you.

## 2020-01-22 ENCOUNTER — Ambulatory Visit (INDEPENDENT_AMBULATORY_CARE_PROVIDER_SITE_OTHER): Payer: Medicare Other | Admitting: Plastic Surgery

## 2020-01-22 ENCOUNTER — Other Ambulatory Visit: Payer: Self-pay

## 2020-01-22 ENCOUNTER — Encounter: Payer: Self-pay | Admitting: Plastic Surgery

## 2020-01-22 VITALS — BP 103/72 | HR 74 | Temp 97.3°F

## 2020-01-22 DIAGNOSIS — Z9889 Other specified postprocedural states: Secondary | ICD-10-CM

## 2020-02-13 NOTE — Progress Notes (Deleted)
Patient is a 59 year old female here for follow-up after undergoing bilateral breast reduction on 11/05/2019 with Dr. Arita Miss.  The vertical limb incision and NAC had dehisced. Showed good improvement at last visit on 6/30.  ~ 14 weeks PO

## 2020-02-14 ENCOUNTER — Ambulatory Visit: Payer: Medicare Other | Admitting: Plastic Surgery

## 2020-02-23 NOTE — Progress Notes (Deleted)
Patient is a 58 yr-old female here for follow-up after undergoing bilateral breast reduction on 11/05/19 with Dr. Arita Miss.  There vertical limb and NAC had dehisced.   ~ 16 weeks PO  photos

## 2020-02-25 ENCOUNTER — Encounter: Payer: Self-pay | Admitting: Plastic Surgery

## 2020-02-25 ENCOUNTER — Ambulatory Visit (INDEPENDENT_AMBULATORY_CARE_PROVIDER_SITE_OTHER): Payer: Medicare Other | Admitting: Plastic Surgery

## 2020-02-25 ENCOUNTER — Ambulatory Visit: Payer: Medicare Other | Admitting: Plastic Surgery

## 2020-02-25 ENCOUNTER — Other Ambulatory Visit: Payer: Self-pay

## 2020-02-25 VITALS — BP 109/75 | HR 76 | Temp 97.8°F

## 2020-02-25 DIAGNOSIS — Z9889 Other specified postprocedural states: Secondary | ICD-10-CM

## 2020-02-25 NOTE — Progress Notes (Signed)
The patient is a 59 yrs old bf here for follow up after bilateral breast reductions. She noticed a stitch working its way out on the right vertical limb.  The stitch was removed.  No sign of infection.  The left breast has some scarring.  Could have revision in the future if she desires.  Follow up in 2 months.

## 2020-04-30 ENCOUNTER — Encounter: Payer: Self-pay | Admitting: Plastic Surgery

## 2020-04-30 ENCOUNTER — Ambulatory Visit (INDEPENDENT_AMBULATORY_CARE_PROVIDER_SITE_OTHER): Payer: Medicare Other | Admitting: Plastic Surgery

## 2020-04-30 ENCOUNTER — Other Ambulatory Visit: Payer: Self-pay

## 2020-04-30 VITALS — BP 118/80 | HR 76 | Temp 98.1°F

## 2020-04-30 DIAGNOSIS — N62 Hypertrophy of breast: Secondary | ICD-10-CM | POA: Diagnosis not present

## 2020-04-30 NOTE — Progress Notes (Signed)
   Referring Provider Olive Bass, MD [address] [place],  [place] [address]   CC:  Chief Complaint  Patient presents with  . Follow-up      [name] [name] is an 59 y.o. female.  HPI: Patient presents about 6 months out from bilateral breast reduction. She feels like everything is healed up well at this point. She is overall happy with the outcome. She is hoping for a little bit more fading of the scar particularly on the left side where she had a dehiscence. Otherwise she is doing well  Review of Systems General:. Denies fevers or chills  Physical Exam Vitals with BMI 04/30/2020 02/25/2020 01/22/2020  Height - - -  Weight - - -  BMI - - -  Systolic 118 109 466  Diastolic 80 75 72  Pulse 76 76 74    General:  No acute distress,  Alert and oriented, Non-Toxic, Normal speech and affect On examination everything is healed up nicely. She has good shape and symmetry. Nipple areolar complexes are viable with a little bit of extra scarring around the left side which is where the dehiscence was. Otherwise everything is soft with no wounds or points of concern.  Assessment/Plan Patient 6 months out from bilateral breast reduction. She is all healed up at this point. We discussed what to look for going forward. We will plan to follow-up with her on an as-needed basis. All her questions were answered.  Allena Napoleon 04/30/2020, 10:01 AM

## 2020-05-01 ENCOUNTER — Ambulatory Visit: Payer: Medicare Other | Admitting: Plastic Surgery

## 2021-04-12 ENCOUNTER — Encounter: Payer: Self-pay | Admitting: Gastroenterology

## 2021-06-14 ENCOUNTER — Encounter: Payer: Self-pay | Admitting: Gastroenterology

## 2021-08-05 ENCOUNTER — Other Ambulatory Visit: Payer: Self-pay

## 2021-08-05 ENCOUNTER — Ambulatory Visit (AMBULATORY_SURGERY_CENTER): Payer: Medicare Other

## 2021-08-05 VITALS — Ht 64.0 in | Wt 201.0 lb

## 2021-08-05 DIAGNOSIS — Z86010 Personal history of colonic polyps: Secondary | ICD-10-CM

## 2021-08-05 NOTE — Progress Notes (Addendum)
Patient reports egg allergy-  however, patient reports she east cakes and pies cooked with eggs and has no reaction to the foods (containing eggs);  No soy allergy known to patient  No issues known to pt with past sedation with any surgeries or procedures Patient denies ever being told they had issues or difficulty with intubation  No FH of Malignant Hyperthermia Pt is not on diet pills Pt is not on home 02  Pt is not on blood thinners  Pt denies issues with constipation at this time;- takes Miralax for relief;  No A fib or A flutter Pt is fully vaccinated for Covid; NO PA's for preps discussed with pt in PV today  Discussed with pt there will be an out-of-pocket cost for prep and that varies from $0 to 70 + dollars - pt verbalized understanding  Due to the COVID-19 pandemic we are asking patients to follow certain guidelines in PV and the [facility]   Pt aware of COVID protocols and [facility] guidelines  PV completed over the phone. Pt verified name, DOB, address and insurance during PV today.  Pt mailed instruction packet with copy of consent form to read and not return, and instructions.  Pt encouraged to call with questions or issues.  If pt has My chart, procedure instructions sent via My Chart

## 2021-08-19 ENCOUNTER — Encounter: Payer: Self-pay | Admitting: Gastroenterology

## 2021-08-19 ENCOUNTER — Ambulatory Visit (AMBULATORY_SURGERY_CENTER): Payer: Medicare Other | Admitting: Gastroenterology

## 2021-08-19 ENCOUNTER — Other Ambulatory Visit: Payer: Self-pay

## 2021-08-19 VITALS — BP 135/84 | HR 64 | Temp 98.0°F | Resp 12 | Ht 64.0 in | Wt 201.0 lb

## 2021-08-19 DIAGNOSIS — Z86010 Personal history of colonic polyps: Secondary | ICD-10-CM

## 2021-08-19 MED ORDER — SODIUM CHLORIDE 0.9 % IV SOLN: 500.0000 mL | Freq: Once | INTRAVENOUS | Status: DC

## 2021-08-19 NOTE — Progress Notes (Signed)
Pt's states no medical or surgical changes since previsit or office visit.  VS CW  

## 2021-08-19 NOTE — Patient Instructions (Signed)
Impression/Recommendations:  Diverticulosis handout given to patient.  Resume previous diet. Continue present medications. Repeat colonoscopy in 10 years for screening purposes.  Earlier, if any new problems or change in family history.  YOU HAD AN ENDOSCOPIC PROCEDURE TODAY AT THE [facility]:   Refer to the procedure report that was given to you for any specific questions about what was found during the examination.  If the procedure report does not answer your questions, please call your gastroenterologist to clarify.  If you requested that your care partner not be given the details of your procedure findings, then the procedure report has been included in a sealed envelope for you to review at your convenience later.  YOU SHOULD EXPECT: Some feelings of bloating in the abdomen. Passage of more gas than usual.  Walking can help get rid of the air that was put into your GI tract during the procedure and reduce the bloating. If you had a lower endoscopy (such as a colonoscopy or flexible sigmoidoscopy) you may notice spotting of blood in your stool or on the toilet paper. If you underwent a bowel prep for your procedure, you may not have a normal bowel movement for a few days.  Please Note:  You might notice some irritation and congestion in your nose or some drainage.  This is from the oxygen used during your procedure.  There is no need for concern and it should clear up in a day or so.  SYMPTOMS TO REPORT IMMEDIATELY:  Following lower endoscopy (colonoscopy or flexible sigmoidoscopy):  Excessive amounts of blood in the stool  Significant tenderness or worsening of abdominal pains  Swelling of the abdomen that is new, acute  Fever of 100F or higher  For urgent or emergent issues, a gastroenterologist can be reached at any hour by calling [phone]. Do not use MyChart messaging for urgent concerns.    DIET:  We do recommend a small meal at first, but then you may  proceed to your regular diet.  Drink plenty of fluids but you should avoid alcoholic beverages for 24 hours.  ACTIVITY:  You should plan to take it easy for the rest of today and you should NOT DRIVE or use heavy machinery until tomorrow (because of the sedation medicines used during the test).    FOLLOW UP: Our staff will call the number listed on your records 48-72 hours following your procedure to check on you and address any questions or concerns that you may have regarding the information given to you following your procedure. If we do not reach you, we will leave a message.  We will attempt to reach you two times.  During this call, we will ask if you have developed any symptoms of COVID 19. If you develop any symptoms (ie: fever, flu-like symptoms, shortness of breath, cough etc.) before then, please call [phone].  If you test positive for Covid 19 in the 2 weeks post procedure, please call and report this information to [place].    If any biopsies were taken you will be contacted by phone or by letter within the next 1-3 weeks.  Please call us at [phone] if you have not heard about the biopsies in 3 weeks.    SIGNATURES/CONFIDENTIALITY: You and/or your care partner have signed paperwork which will be entered into your electronic medical record.  These signatures attest to the fact that that the information above on your After Visit Summary has been reviewed and is understood.  Full  responsibility of the confidentiality of this discharge information lies with you and/or your care-partner.

## 2021-08-19 NOTE — Progress Notes (Signed)
[facility] History and Physical   Primary Care Physician:  Algis Greenhouse, MD   Reason for Procedure:   History of polyps  Plan:     colonoscopy     HPI: [name] [name] is a 61 y.o. female    Past Medical History:  Diagnosis Date   Allergy    Arthritis    generalized   Asthma    Chronic back pain    Depression    hx of   Diabetes mellitus without complication (HCC)    type 2-on meds   Dysrhythmia    hisotry of tachycardia   Fibromyalgia    GERD (gastroesophageal reflux disease)    Headache    Hypercholesteremia    on meds   Hypertension    on meds   Sickle cell trait [facility])     Past Surgical History:  Procedure Laterality Date   ABDOMINAL HYSTERECTOMY     BREAST REDUCTION SURGERY Bilateral 11/05/2019   Procedure: MAMMARY REDUCTION  (BREAST);  Surgeon: Cindra Presume, MD;  Location: [facility];  Service: Plastics;  Laterality: Bilateral;  3.5 hours, please   CESAREAN SECTION     COLONOSCOPY  07/26/2015   MAC-mira(good)-tics/polyp   EXPLORATORY LAPAROTOMY     KNEE ARTHROSCOPY     2 on left, one on right    Prior to Admission medications   Medication Sig Start Date End Date Taking? Authorizing Provider  aspirin EC 81 MG tablet Take 81 mg by mouth daily.   Yes [provider]  atorvastatin (LIPITOR) 80 MG tablet Take 80 mg by mouth daily. 01/12/16  Yes [provider]  CALCIUM-MAGNESIUM-ZINC PO Take 1 tablet by mouth daily at 6 (six) AM.   Yes [provider]  Cholecalciferol (VITAMIN D3 PO) Take 1 tablet by mouth daily at 6 (six) AM.   Yes [provider]  esomeprazole (NEXIUM) 40 MG capsule TAKE ONE CAPSULE ONCE DAILY 08/07/15  Yes Kozlow, Donnamarie Poag, MD  ezetimibe (ZETIA) 10 MG tablet Take 10 mg by mouth daily. 08/02/21  Yes [provider]  GARCINIA CAMBOGIA-CHROMIUM PO Take 2 tablets by mouth daily at 6 (six) AM.   Yes [provider]  irbesartan-hydrochlorothiazide (AVALIDE)  300-12.5 MG tablet Take 1/2 tablet by mouth daily. 01/12/16  Yes [provider]  loratadine (CLARITIN) 10 MG tablet Take 1 tablet (10 mg total) by mouth daily as needed for allergies. 07/15/15  Yes Kozlow, Donnamarie Poag, MD  montelukast (SINGULAIR) 10 MG tablet Take 1 tablet (10 mg total) by mouth daily. 07/15/15  Yes Kozlow, Donnamarie Poag, MD  oxyCODONE (OXY IR/ROXICODONE) 5 MG immediate release tablet oxycodone 5 mg tablet  take 1/2 to 1 Tablet by mouth four times daily, max daily dose 4 Tablet 03/21/19  Yes [provider]  potassium chloride (MICRO-K) 10 MEQ CR capsule Take 10 mEq by mouth daily. 08/02/21  Yes [provider]  VITAMIN E PO Take 2 capsules by mouth daily at 6 (six) AM.   Yes [provider]  ACCU-CHEK GUIDE test strip USE TO TEST BLOOD SUGAR DAILY 07/01/21   [provider]  Accu-Chek Softclix Lancets lancets Accu-Chek Softclix Lancets  USE TO TEST BLOOD SUGAR DAILY    [provider]  Accu-Chek Softclix Lancets lancets USE TO TEST BLOOD SUGAR DAILY 09/01/20   [provider]  Accu-Chek Softclix Lancets lancets USE TO TEST BLOOD SUGAR DAILY 06/02/21   [provider]  albuterol (VENTOLIN HFA) 108 (90 Base)  MCG/ACT inhaler Inhale 2 puffs into the lungs every 4 (four) hours as needed for wheezing or shortness of breath.    [provider]  Blood Glucose Monitoring Suppl (ACCU-CHEK AVIVA PLUS) w/Device KIT See admin instructions. 09/02/19   [provider]  Blood Glucose Monitoring Suppl (ACCU-CHEK AVIVA PLUS) w/Device KIT accu-chek    kit aviva pl    [provider]  cyclobenzaprine (FLEXERIL) 10 MG tablet Take 1 tablet by mouth daily as needed. 05/04/12   [provider]  famotidine (PEPCID) 40 MG tablet Take 40 mg by mouth daily. 08/02/21   [provider]  glucose blood (ACCU-CHEK AVIVA PLUS) test strip Accu-Chek Aviva Plus test strips    [provider]  glucose blood test  strip Accu-Chek Aviva Plus test strips  USE TO TEST BLOOD SUGAR DAILY    [provider]  ibuprofen (ADVIL) 600 MG tablet Take 600 mg by mouth every 6 (six) hours as needed.    [provider]  methocarbamol (ROBAXIN) 500 MG tablet Take 1 tablet (500 mg total) by mouth 3 (three) times daily as needed for muscle spasms. 02/10/16   Melina Schools, MD  naloxone [facility]) nasal spray 4 mg/0.1 mL Narcan 4 mg/actuation nasal spray    [provider]  ranitidine (ZANTAC) 300 MG tablet Take 1 tablet (300 mg total) by mouth at bedtime. 07/15/15   Kozlow, Donnamarie Poag, MD    Current Outpatient Medications  Medication Sig Dispense Refill   aspirin EC 81 MG tablet Take 81 mg by mouth daily.     atorvastatin (LIPITOR) 80 MG tablet Take 80 mg by mouth daily.  11   CALCIUM-MAGNESIUM-ZINC PO Take 1 tablet by mouth daily at 6 (six) AM.     Cholecalciferol (VITAMIN D3 PO) Take 1 tablet by mouth daily at 6 (six) AM.     esomeprazole (NEXIUM) 40 MG capsule TAKE ONE CAPSULE ONCE DAILY 30 capsule 0   ezetimibe (ZETIA) 10 MG tablet Take 10 mg by mouth daily.     GARCINIA CAMBOGIA-CHROMIUM PO Take 2 tablets by mouth daily at 6 (six) AM.     irbesartan-hydrochlorothiazide (AVALIDE) 300-12.5 MG tablet Take 1/2 tablet by mouth daily.  11   loratadine (CLARITIN) 10 MG tablet Take 1 tablet (10 mg total) by mouth daily as needed for allergies. 30 tablet 0   montelukast (SINGULAIR) 10 MG tablet Take 1 tablet (10 mg total) by mouth daily. 30 tablet 0   oxyCODONE (OXY IR/ROXICODONE) 5 MG immediate release tablet oxycodone 5 mg tablet  take 1/2 to 1 Tablet by mouth four times daily, max daily dose 4 Tablet     potassium chloride (MICRO-K) 10 MEQ CR capsule Take 10 mEq by mouth daily.     VITAMIN E PO Take 2 capsules by mouth daily at 6 (six) AM.     ACCU-CHEK GUIDE test strip USE TO TEST BLOOD SUGAR DAILY     Accu-Chek Softclix Lancets lancets Accu-Chek Softclix Lancets  USE TO TEST BLOOD SUGAR DAILY      Accu-Chek Softclix Lancets lancets USE TO TEST BLOOD SUGAR DAILY     Accu-Chek Softclix Lancets lancets USE TO TEST BLOOD SUGAR DAILY     albuterol (VENTOLIN HFA) 108 (90 Base) MCG/ACT inhaler Inhale 2 puffs into the lungs every 4 (four) hours as needed for wheezing or shortness of breath.     Blood Glucose Monitoring Suppl (ACCU-CHEK AVIVA PLUS) w/Device KIT See admin instructions.     Blood Glucose Monitoring  Suppl (ACCU-CHEK AVIVA PLUS) w/Device KIT accu-chek    kit aviva pl     cyclobenzaprine (FLEXERIL) 10 MG tablet Take 1 tablet by mouth daily as needed.     famotidine (PEPCID) 40 MG tablet Take 40 mg by mouth daily.     glucose blood (ACCU-CHEK AVIVA PLUS) test strip Accu-Chek Aviva Plus test strips     glucose blood test strip Accu-Chek Aviva Plus test strips  USE TO TEST BLOOD SUGAR DAILY     ibuprofen (ADVIL) 600 MG tablet Take 600 mg by mouth every 6 (six) hours as needed.     methocarbamol (ROBAXIN) 500 MG tablet Take 1 tablet (500 mg total) by mouth 3 (three) times daily as needed for muscle spasms. 60 tablet 0   naloxone (NARCAN) nasal spray 4 mg/0.1 mL Narcan 4 mg/actuation nasal spray     ranitidine (ZANTAC) 300 MG tablet Take 1 tablet (300 mg total) by mouth at bedtime. 30 tablet 0   Current Facility-Administered Medications  Medication Dose Route Frequency Provider Last Rate Last Admin   0.9 %  sodium chloride infusion  500 mL Intravenous Once Jackquline Denmark, MD        Allergies as of 08/19/2021 - Review Complete 08/19/2021  Allergen Reaction Noted   Eggs or egg-derived products Anaphylaxis 02/02/2016   Tea Anaphylaxis 02/02/2016   Azithromycin Hives 02/02/2016   Celebrex [celecoxib] Nausea Only 01/29/2016   Contrast media [iodinated contrast media] Nausea And Vomiting 02/02/2016   Flax seed [bio-flax] Hives 02/02/2016   Grapeseed extract [nutritional supplements] Hives 02/02/2016   Lisinopril Hives 02/02/2016   Other Hives 02/02/2016   Paroxetine Other (See Comments)  02/02/2016   Shellfish allergy Hives 02/02/2016   Tramadol Hives 01/29/2016   Vicodin [hydrocodone-acetaminophen] Hives and Itching 01/29/2016    Family History  Problem Relation Age of Onset   Colon polyps Neg Hx    Colon cancer Neg Hx    Esophageal cancer Neg Hx    Rectal cancer Neg Hx    Stomach cancer Neg Hx     Social History   Socioeconomic History   Marital status: Single    Spouse name: Not on file   Number of children: Not on file   Years of education: Not on file   Highest education level: Not on file  Occupational History   Not on file  Tobacco Use   Smoking status: Never   Smokeless tobacco: Never  Vaping Use   Vaping Use: Never used  Substance and Sexual Activity   Alcohol use: No   Drug use: Yes    Types: Oxycodone   Sexual activity: Not on file  Other Topics Concern   Not on file  Social History Narrative   Not on file   Social Determinants of Health   Financial Resource Strain: Not on file  Food Insecurity: Not on file  Transportation Needs: Not on file  Physical Activity: Not on file  Stress: Not on file  Social Connections: Not on file  Intimate Partner Violence: Not on file    Review of Systems: Positive for none All other review of systems negative except as mentioned in the HPI.  Physical Exam: Vital signs in last 24 hours: _0 @   General:   Alert,  Well-developed, well-nourished, pleasant and cooperative in NAD Lungs:  Clear throughout to auscultation.   Heart:  Regular rate and rhythm; no murmurs, clicks, rubs,  or gallops. Abdomen:  Soft, nontender and nondistended. Normal bowel sounds.   Neuro/Psych:  Alert and cooperative. Normal mood and affect. A and O x 3    No significant changes were identified.  The patient continues to be an appropriate candidate for the planned procedure and anesthesia.   Carmell Austria, MD. [facility] 08/19/2021 8:22 AM@

## 2021-08-19 NOTE — Op Note (Signed)
[facility] Patient Name: [name] [name] Procedure Date: 08/19/2021 8:22 AM MRN: [id number] Endoscopist: Jackquline Denmark , MD Age: 61 Referring MD:  Date of Birth: 28-Sep-1960 Gender: Female Account #: [id number] Procedure:                Colonoscopy Indications:              High risk colon cancer surveillance: Personal                            history of colonic polyps Medicines:                Monitored Anesthesia Care Procedure:                Pre-Anesthesia Assessment:                           - Prior to the procedure, a History and Physical                            was performed, and patient medications and                            allergies were reviewed. The patient's tolerance of                            previous anesthesia was also reviewed. The risks                            and benefits of the procedure and the sedation                            options and risks were discussed with the patient.                            All questions were answered, and informed consent                            was obtained. Prior Anticoagulants: The patient has                            taken no previous anticoagulant or antiplatelet                            agents. ASA Grade Assessment: II - A patient with                            mild systemic disease. After reviewing the risks                            and benefits, the patient was deemed in                            satisfactory condition to undergo the procedure.  After obtaining informed consent, the colonoscope                            was passed under direct vision. Throughout the                            procedure, the patient's blood pressure, pulse, and                            oxygen saturations were monitored continuously. The                            PCF-HQ190L Colonoscope was introduced through the                            anus and advanced to the 2 cm into the  ileum. The                            colonoscopy was performed without difficulty. The                            patient tolerated the procedure well. The quality                            of the bowel preparation was good. The terminal                            ileum, ileocecal valve, appendiceal orifice, and                            rectum were photographed. Scope In: 8:30:49 AM Scope Out: 8:48:47 AM Scope Withdrawal Time: 0 hours 11 minutes 4 seconds  Total Procedure Duration: 0 hours 17 minutes 58 seconds  Findings:                 A few rare (1-2) small-mouthed diverticula were                            found in the sigmoid colon.                           There was a small lipoma, 8 mm in diameter, in the                            mid ascending colon.                           The ileocecal valve was moderately lipomatous.                           Non-bleeding internal hemorrhoids were found during                            retroflexion. The hemorrhoids were small and Grade  I (internal hemorrhoids that do not prolapse).                           The terminal ileum appeared normal.                           The exam was otherwise without abnormality on                            direct and retroflexion views. The colon was highly                            redundant. Complications:            No immediate complications. Estimated Blood Loss:     Estimated blood loss: none. Impression:               -Minimal sigmoid diverticulosis.                           -Otherwise normal colonoscopy to TI.                           -No specimens collected. Recommendation:           - Patient has a contact number available for                            emergencies. The signs and symptoms of potential                            delayed complications were discussed with the                            patient. Return to normal activities tomorrow.                             Written discharge instructions were provided to the                            patient.                           - Resume previous diet.                           - Continue present medications.                           - Repeat colonoscopy in 10 years for screening                            purposes. Earlier, if with any new problems or                            change in family history.                           -  The findings and recommendations were discussed                            with the patient's family. Jackquline Denmark, MD 08/19/2021 8:53:11 AM This report has been signed electronically.

## 2021-08-19 NOTE — Progress Notes (Signed)
PT taken to PACU. Monitors in place. VSS. Report given to RN. 

## 2021-08-23 ENCOUNTER — Telehealth: Payer: Self-pay

## 2021-08-23 NOTE — Telephone Encounter (Signed)
°  Follow up Call-  Call back number 08/19/2021  Post procedure Call Back phone  # [phone]  Permission to leave phone message Yes  Some recent data might be hidden     Patient questions:  Do you have a fever, pain , or abdominal swelling? No. Pain Score  0 *  Have you tolerated food without any problems? Yes.    Have you been able to return to your normal activities? Yes.    Do you have any questions about your discharge instructions: Diet   No. Medications  No. Follow up visit  No.  Do you have questions or concerns about your Care? No.  Actions: * If pain score is 4 or above: No action needed, pain <4.

## 2023-10-24 ENCOUNTER — Encounter: Payer: Self-pay | Admitting: Allergy

## 2023-10-24 ENCOUNTER — Ambulatory Visit (INDEPENDENT_AMBULATORY_CARE_PROVIDER_SITE_OTHER): Admitting: Allergy

## 2023-10-24 VITALS — BP 122/82 | HR 74 | Resp 16 | Ht 64.0 in | Wt 192.8 lb

## 2023-10-24 DIAGNOSIS — T781XXD Other adverse food reactions, not elsewhere classified, subsequent encounter: Secondary | ICD-10-CM | POA: Diagnosis not present

## 2023-10-24 DIAGNOSIS — H109 Unspecified conjunctivitis: Secondary | ICD-10-CM

## 2023-10-24 DIAGNOSIS — J4530 Mild persistent asthma, uncomplicated: Secondary | ICD-10-CM

## 2023-10-24 DIAGNOSIS — J310 Chronic rhinitis: Secondary | ICD-10-CM | POA: Diagnosis not present

## 2023-10-24 DIAGNOSIS — H1013 Acute atopic conjunctivitis, bilateral: Secondary | ICD-10-CM

## 2023-10-24 DIAGNOSIS — L508 Other urticaria: Secondary | ICD-10-CM | POA: Diagnosis not present

## 2023-10-24 MED ORDER — LEVOCETIRIZINE DIHYDROCHLORIDE 5 MG PO TABS: ORAL_TABLET | ORAL | 5 refills | Status: AC

## 2023-10-24 MED ORDER — EPINEPHRINE 0.3 MG/0.3ML IJ SOAJ: INTRAMUSCULAR | 3 refills | Status: AC

## 2023-10-24 NOTE — Patient Instructions (Addendum)
 Chronic Urticaria Chronic urticaria with hives occurring a couple of times a week for years. Hives are itchy, with lip swelling, and are sometimes triggered by foods, heat, and pressure. She has been on Singulair, loratadine, and Pepcid for years.  Hives can be caused by a variety of different triggers including illness/infection, foods, medications, stings, exercise, pressure, vibrations, extremes of temperature to name a few however majority of the time there is no identifiable trigger.  Your symptoms have been ongoing for >6 weeks making this chronic thus will obtain labwork to evaluate: CBC w diff, CMP, tryptase, hive panel, environmental panel, alpha-gal panel, as well as foods causing hives.  Xolair was discussed as a potential treatment for chronic hives, with an average treatment duration of about a year. Xolair is well tolerated with no significant side effects, and most patients are prescribed an EpiPen due to food allergies. - Switch loratadine to Xyzal 5mg  1 tab twice a day. - Initiate Xolair injections monthly for chronic urticaria.  Tammy, our Xolair coordinator, will call you regarding this prescription.  Provided informational pamphlets on Xolair. - Prescribe an EpiPen for emergency use.  Follow emergency action plan in case of allergic reaction  Asthma Adult-onset asthma with symptoms triggered by pollen, ragweed, and COVID-19. She uses an albuterol inhaler twice a month for relief of coughing and difficulty breathing. She is not on a daily maintenance inhaler, and Singulair is used for both allergy and asthma control. - Perform spirometry to assess baseline lung function--- normal lung function today! - Continue Singulair for asthma control. - Have access to albuterol inhaler 2 puffs every 4-6 hours as needed for cough/wheeze/shortness of breath/chest tightness.  May use 15-20 minutes prior to activity.   Monitor frequency of use.    Allergic Rhinitis Allergic rhinitis with symptoms  of runny nose, sneezing primarily.  - Switch loratadine to Xyzal as above - Continue Singulair as above - Recommend use of nasal spray like Flonase, Rhinocort or Nasacort for nasal congestion control.  Use 2 sprays each nostril daily for 1-2 weeks at a time before stopping once nasal congestion improves for maximum benefit   Follow-up 2-3 months or sooner if needed

## 2023-10-24 NOTE — Progress Notes (Signed)
 New Patient Note  RE: [name] [name] MRN: [id number] DOB: [birth year] Date of Office Visit: 10/24/2023  Primary care provider: Olive Bass, MD  Chief Complaint: hives, sinus issues  History of present illness: [name] [name] is a 63 y.o. female presenting today for evaluation of allergies.  Discussed the use of AI scribe software for clinical note transcription with the patient, who gave verbal consent to proceed.  She has been experiencing worsening allergy symptoms, including hives, rhinorrhea, sneezing, and occasional dyspnea. These symptoms have progressively worsened over time, and she suspects changes in allergens since her last visit before 2017.  Hives she believes occurs primarily after consuming certain foods such as strawberries, lima beans, flax seed, shellfish, and tea, with a suspicion that eggs may now be a trigger. The hives appear as small, pruritic bumps that are erythematous in the center and pale on the outside, occurring a couple of times a week. This has been a persistent issue since she was 63 years old and is sometimes accompanied by angioedema of the lips. She finds relief with diphenhydramine and has received epinephrine at the hospital on several occasions she is presented there. She does not have an epinephrine auto-injector due to previous cost concerns.  She has a history of asthma, which developed in adulthood. Her asthma symptoms, including coughing and dyspnea, are triggered by pollen, ragweed, and COVID-19. She uses an albuterol inhaler approximately twice a month for symptom relief.  Her current medications include montelukast, loratadine, and famotidine, which she has been taking for a long time. She occasionally takes oxycodone and ibuprofen as needed, which sometimes the oxycodone cause pruritus but not hives.   She has previously taken allergy shots for several years and has been on loratadine for a long time, sometimes taking two tablets a day  for better control.      Review of systems: 10pt ROS negative unless noted above in HPI  Past medical history: Past Medical History:  Diagnosis Date   Allergy    Arthritis    generalized   Asthma    Chronic back pain    Depression    hx of   Diabetes mellitus without complication (HCC)    type 2-on meds   Dysrhythmia    hisotry of tachycardia   Fibromyalgia    GERD (gastroesophageal reflux disease)    Headache    Hypercholesteremia    on meds   Hypertension    on meds   Sickle cell trait (HCC)     Past surgical history: Past Surgical History:  Procedure Laterality Date   ABDOMINAL HYSTERECTOMY     BACK SURGERY     BREAST REDUCTION SURGERY Bilateral 11/05/2019   Procedure: MAMMARY REDUCTION  (BREAST);  Surgeon: Allena Napoleon, MD;  Location: [facility];  Service: Plastics;  Laterality: Bilateral;  3.5 hours, please   CESAREAN SECTION     COLONOSCOPY  07/26/2015   MAC-mira(good)-tics/polyp   EXPLORATORY LAPAROTOMY     KNEE ARTHROSCOPY     2 on left, one on right  `  Family history:  Family History  Problem Relation Age of Onset   Colon polyps Neg Hx    Colon cancer Neg Hx    Esophageal cancer Neg Hx    Rectal cancer Neg Hx    Stomach cancer Neg Hx     Social history: Lives in a home without carpeting with electric heating and AC cooling.  No pets in the home.  No  concern for water damage, mildew or roaches in the home.  Not currently working.  Denies a smoking history.    Medication List: Current Outpatient Medications  Medication Sig Dispense Refill   ACCU-CHEK GUIDE test strip USE TO TEST BLOOD SUGAR DAILY     Accu-Chek Softclix Lancets lancets Accu-Chek Softclix Lancets  USE TO TEST BLOOD SUGAR DAILY     Accu-Chek Softclix Lancets lancets USE TO TEST BLOOD SUGAR DAILY     Accu-Chek Softclix Lancets lancets USE TO TEST BLOOD SUGAR DAILY     albuterol (VENTOLIN HFA) 108 (90 Base) MCG/ACT inhaler Inhale 2 puffs into the lungs every 4  (four) hours as needed for wheezing or shortness of breath.     aspirin EC 81 MG tablet Take 81 mg by mouth daily.     atorvastatin (LIPITOR) 80 MG tablet Take 80 mg by mouth daily.  11   Blood Glucose Monitoring Suppl (ACCU-CHEK AVIVA PLUS) w/Device KIT See admin instructions.     Blood Glucose Monitoring Suppl (ACCU-CHEK AVIVA PLUS) w/Device KIT accu-chek    kit aviva pl     esomeprazole (NEXIUM) 40 MG capsule TAKE ONE CAPSULE ONCE DAILY 30 capsule 0   ezetimibe (ZETIA) 10 MG tablet Take 10 mg by mouth daily.     famotidine (PEPCID) 40 MG tablet Take 40 mg by mouth daily.     glucose blood (ACCU-CHEK AVIVA PLUS) test strip Accu-Chek Aviva Plus test strips     glucose blood test strip Accu-Chek Aviva Plus test strips  USE TO TEST BLOOD SUGAR DAILY     ibuprofen (ADVIL) 600 MG tablet Take 600 mg by mouth every 6 (six) hours as needed.     loratadine (CLARITIN) 10 MG tablet Take 1 tablet (10 mg total) by mouth daily as needed for allergies. 30 tablet 0   montelukast (SINGULAIR) 10 MG tablet Take 1 tablet (10 mg total) by mouth daily. 30 tablet 0   naloxone (NARCAN) nasal spray 4 mg/0.1 mL Narcan 4 mg/actuation nasal spray     oxyCODONE (OXY IR/ROXICODONE) 5 MG immediate release tablet oxycodone 5 mg tablet  take 1/2 to 1 Tablet by mouth four times daily, max daily dose 4 Tablet     potassium chloride (MICRO-K) 10 MEQ CR capsule Take 10 mEq by mouth daily.     OZEMPIC, 2 MG/DOSE, 8 MG/3ML SOPN SMARTSIG:0.75 Milliliter(s) SUB-Q Once a Week     No current facility-administered medications for this visit.    Known medication allergies: Allergies  Allergen Reactions   Egg-Derived Products Anaphylaxis   Tea Anaphylaxis   Azithromycin Hives   Celebrex [Celecoxib] Nausea Only    Stomach upset   Contrast Media [Iodinated Contrast Media] Nausea And Vomiting   Flax Seed [Flax Seed Oil] Hives   Grapeseed Extract [Nutritional Supplements] Hives   Lisinopril Hives   Morphine Other (See Comments)    Other Hives    [place] Beans, strawberries, chicken embryo derived   Paroxetine Other (See Comments)    Hallucinations   Shellfish Allergy Hives   Tramadol Hives   Vicodin [Hydrocodone-Acetaminophen] Hives and Itching     Physical examination: Blood pressure 122/82, pulse 74, resp. rate 16, height 5\' 4"  (1.626 m), weight 192 lb 12.8 oz (87.5 kg), SpO2 97%.  General: Alert, interactive, in no acute distress. HEENT: PERRLA, TMs pearly gray, turbinates minimally edematous without discharge, post-pharynx non erythematous. Neck: Supple without lymphadenopathy. Lungs: Clear to auscultation without wheezing, rhonchi or rales. {no increased work of breathing. CV: Normal  S1, S2 without murmurs. Abdomen: Nondistended, nontender. Skin: Warm and dry, without lesions or rashes. Extremities:  No clubbing, cyanosis or edema. Neuro:   Grossly intact.  Diagnositics/Labs: Spirometry: FEV1: 1.84L 86%, FVC: 2.24L 83%, ratio consistent with nonobstructive pattern  Assessment and plan: Chronic Urticaria Chronic urticaria with hives occurring a couple of times a week for years. Hives are itchy, with lip swelling, and are sometimes triggered by foods, heat, and pressure. She has been on Singulair, loratadine, and Pepcid for years.  Hives can be caused by a variety of different triggers including illness/infection, foods, medications, stings, exercise, pressure, vibrations, extremes of temperature to name a few however majority of the time there is no identifiable trigger.  Your symptoms have been ongoing for >6 weeks making this chronic thus will obtain labwork to evaluate: CBC w diff, CMP, tryptase, hive panel, environmental panel, alpha-gal panel, as well as foods causing hives.  Xolair was discussed as a potential treatment for chronic hives, with an average treatment duration of about a year. Xolair is well tolerated with no significant side effects, and most patients are prescribed an EpiPen due to food  allergies. - Switch loratadine to Xyzal 5mg  1 tab twice a day. - Initiate Xolair injections monthly for chronic urticaria.  Tammy, our Xolair coordinator, will call you regarding this prescription.  Provided informational pamphlets on Xolair. - Prescribe an EpiPen for emergency use.  Follow emergency action plan in case of allergic reaction  Asthma Adult-onset asthma with symptoms triggered by pollen, ragweed, and COVID-19. She uses an albuterol inhaler twice a month for relief of coughing and difficulty breathing. She is not on a daily maintenance inhaler, and Singulair is used for both allergy and asthma control. - Perform spirometry to assess baseline lung function--- normal lung function today! - Continue Singulair for asthma control. - Have access to albuterol inhaler 2 puffs every 4-6 hours as needed for cough/wheeze/shortness of breath/chest tightness.  May use 15-20 minutes prior to activity.   Monitor frequency of use.    Allergic Rhinitis Allergic rhinitis with symptoms of runny nose, sneezing primarily.  - Switch loratadine to Xyzal as above - Continue Singulair as above - Recommend use of nasal spray like Flonase, Rhinocort or Nasacort for nasal congestion control.  Use 2 sprays each nostril daily for 1-2 weeks at a time before stopping once nasal congestion improves for maximum benefit  Follow-up 2-3 months or sooner if needed  I appreciate the opportunity to take part in [name] [name]'s care. Please do not hesitate to contact me with questions.  Sincerely,   Margo Aye, MD Allergy/Immunology [facility]

## 2023-11-03 LAB — ALLERGEN PROFILE, SHELLFISH

## 2023-11-03 LAB — CBC WITH DIFFERENTIAL/PLATELET
Basophils Absolute: 0 10*3/uL (ref 0.0–0.2)
Basos: 0 %
EOS (ABSOLUTE): 0 10*3/uL (ref 0.0–0.4)
Eos: 0 %
Hematocrit: 40.3 % (ref 34.0–46.6)
Hemoglobin: 12.8 g/dL (ref 11.1–15.9)
Immature Grans (Abs): 0 10*3/uL (ref 0.0–0.1)
Immature Granulocytes: 0 %
Lymphocytes Absolute: 1.4 10*3/uL (ref 0.7–3.1)
Lymphs: 37 %
MCH: 26.9 pg (ref 26.6–33.0)
MCHC: 31.8 g/dL (ref 31.5–35.7)
MCV: 85 fL (ref 79–97)
Monocytes Absolute: 0.4 10*3/uL (ref 0.1–0.9)
Monocytes: 10 %
Neutrophils Absolute: 1.9 10*3/uL (ref 1.4–7.0)
Neutrophils: 53 %
Platelets: 223 10*3/uL (ref 150–450)
RBC: 4.76 x10E6/uL (ref 3.77–5.28)
RDW: 13.7 % (ref 11.7–15.4)
WBC: 3.7 10*3/uL (ref 3.4–10.8)

## 2023-11-03 LAB — COMPREHENSIVE METABOLIC PANEL WITH GFR
ALT: 11 IU/L (ref 0–32)
AST: 20 IU/L (ref 0–40)
Albumin: 4 g/dL (ref 3.8–4.9)
Alkaline Phosphatase: 78 IU/L (ref 44–121)
BUN/Creatinine Ratio: 10 — ABNORMAL LOW (ref 12–28)
BUN: 10 mg/dL (ref 8–27)
Bilirubin Total: 0.5 mg/dL (ref 0.0–1.2)
CO2: 22 mmol/L (ref 20–29)
Calcium: 9.3 mg/dL (ref 8.7–10.3)
Chloride: 106 mmol/L (ref 96–106)
Creatinine, Ser: 1.03 mg/dL — ABNORMAL HIGH (ref 0.57–1.00)
Globulin, Total: 2.6 g/dL (ref 1.5–4.5)
Glucose: 98 mg/dL (ref 70–99)
Potassium: 4.7 mmol/L (ref 3.5–5.2)
Sodium: 141 mmol/L (ref 134–144)
Total Protein: 6.6 g/dL (ref 6.0–8.5)
eGFR: 62 mL/min/{1.73_m2} (ref >59)

## 2023-11-03 LAB — ALPHA-GAL PANEL: IgE (Immunoglobulin E), Serum: 7 [IU]/mL (ref 6–495)

## 2023-11-03 LAB — ALLERGENS W/TOTAL IGE AREA 2

## 2023-11-03 LAB — ALLERGEN, STRAWBERRY, F44

## 2023-11-03 LAB — F222-IGE TEA

## 2023-11-03 LAB — TSH+FREE T4
Free T4: 1.19 ng/dL (ref 0.82–1.77)
TSH: 0.776 u[IU]/mL (ref 0.450–4.500)

## 2023-11-03 LAB — THYROID ANTIBODIES (THYROPEROXIDASE & THYROGLOBULIN)
Thyroglobulin Antibody: <1 [IU]/mL (ref 0.0–0.9)
Thyroperoxidase Ab SerPl-aCnc: 17 [IU]/mL (ref 0–34)

## 2023-11-03 LAB — ALLERGEN FLAXSEED F333 IGE

## 2023-11-03 LAB — F182-IGE LIMA BEAN

## 2023-11-03 LAB — TRYPTASE: Tryptase: 3.8 ug/L (ref 2.2–13.2)

## 2023-11-03 LAB — CHRONIC URTICARIA: cu index: 29.5 — ABNORMAL HIGH (ref <10)

## 2023-11-03 LAB — ALLERGEN EGG WHITE F1

## 2024-01-01 NOTE — Progress Notes (Signed)
 [name] [name] [birth year] [id number] Lamar LITTIE Hobby, MD 01/15/2024   ASSESSMENT/PLAN   1. Wellness examination (Primary) 01/15/2024  OVWELL: This is an evaluation without assigned complexity of medical decision making - Your annual wellness visit was completed today. Routine education was provided, including living a healthy lifestyle, cancer screening, and vaccinations. Testing, vaccinations and referrals were discussed, and ordered, as appropriate and desired.  - Tests needed include mammogram, fasting lipid profile (cholesterol), fasting blood sugar. - Vaccinations needed include Influenza, Coronavirus, RSV. - Your next wellness visit is due yearly - -   2. Screening for colon cancer 01/15/2024  OVSCACOLON: Screening for colorectal cancer was addressed at this visit - This review found the following information: Your risk of colon cancer is: Normal. Your current symptom status is: no symptoms present. Your current examination revealed: normal findings.  - Your next test is due: 2033, 10 years - Testing reviewed and offered included: no testing due to age or medical infirmity, testing stools for blood, testing stools for blood and cellular markers of polyps and cancer, imaging, partial colonoscopy, full colonoscopy.  - The testing you selected is: testing options reviewed, including stool for blood, stool for polyps, imaging and colonoscopy/sigmoidoscopy with selected testing: , full colonoscopy - Routine education was provided.  - Recheck in 1 year - -   3. Benign hypertension 01/15/2024 OVHTN:  This is an established problem which is stable (LC MDM) - Your blood pressure is at your goal of JNC-8 HTN goals: for adults all ages over 18 years with diabetes: under 140 systolic and under 90 diastolic, optimal at 130/80, without medication. - Routine disease specific education is provided and medication use is reviewed and discussed. - Medication is not needed at this time -  Continue to work on diet, exercise and healthy weight maintenance. - Monitor your blood pressure and send an update if not staying near your goal. - Monitor your blood pressure and send a list if the top number is staying under 120 or over 140, or if the bottom number is staying over 90  - Monitor your heart rate and send a list if staying under 60 or over 100 - Recheck in about 6 months - -   4. Type 2 diabetes mellitus without complication, without long-term current use of insulin (HCC) 01/15/2024 OVDM:  This is an established problem which is stable (LC MDM). - Your type 2 diabetes is at your goal of A1C under 6.5% - Today your fasting blood glucose is 77 mg/dl, your J8R test is 4.1%. Your A1C test is about the same, with your last A1C 5.8% - Medication use is reviewed and discussed. - A change in medication is not needed at this time. . - Diabetes education was provided regarding medication, low and high blood glucose and response plan, diet, weight maintenance, vision care, and foot care. - Referral to a nutritionist and disease educator was discussed but is deferred at this time by the patient. - Monitor your pre-meal glucose and send a list if staying under 100 or over 200.  - Return for an evaluation and A1C test in about 6 months - -   5. Mixed hyperlipidemia 01/15/2024  OVLIPID: The lipid report is pending and you will be contacted when it has been evaluated. - Routine, medication, diet, exercise, and optimal weight maintenance education was provided. - Recheck in about 6 months - -   6. Morbid obesity due to excess calories (HCC) 01/15/2024  OVOBESITY:  This is an  established problem which is improving (LC MDM)  - Your weight is not at your optimal goal. - A weight higher than optimal leads to, and worsens, certain illnesses like diabetes, high blood pressure, high cholesterol, arthritis and sleep apnea related illnesses. - Routine education is provided. - You need to  limit your daily calories. A smart phone app like my fitness pal can help with this process. - Referral to a nutritionist and disease educator was recommended but is deferred at this time by the patient.  - Referral to a weight loss program was recommended but is deferred at this time by the patient. - Ongoing exercise is needed, this should be done 5 days a week. Your selected exercise is: as able - Behavior modifications are discussed, including parking and walking, using stairs instead of elevators, taking longer to eat, etc. - Recheck in about 6 months - -  Wt Readings from Last 10 Encounters:  01/15/24 85.7 kg (189 lb)  06/15/23 88 kg (194 lb)  03/07/23 91.7 kg (202 lb 3.2 oz)  01/13/23 95.7 kg (211 lb)  12/13/22 94.3 kg (208 lb)  08/23/22 95.3 kg (210 lb)  05/27/22 93 kg (205 lb)  04/26/22 92.5 kg (204 lb)  11/24/21 95.5 kg (210 lb 9.6 oz)  05/21/21 91.2 kg (201 lb)     7. Encounter for screening mammogram for malignant neoplasm of breast 01/15/2024  OVSCABREAST: Screening for breast cancer was addressed at this visit - This review found the following information: Your risk of breast cancer is: Normal. Your current symptom status is: no symptoms present. Your current examination revealed: normal findings.  - Your next mammogram is due: 2024, 1 year - Routine education was provided.  - Recheck in 1 year - -  8. Gastroesophageal reflux disease, unspecified whether esophagitis present 01/15/2024 OVGERD:  This is an established problem which is stable (LC MDM)  - Your reflux is at goal: controlled and without symptoms.  - A change in medication - A change in medication is not needed at this time.. - Try to avoid long time use of PPI medications like prilosec and nexium .  As able change over to zantac , pepcid or a similar medication.  This can be prescribed or bought over the counter. - If you continue a long term proton pump inhibitor (nexium , protonix, prevacid, achiphex), then  daily add a multivitamin with magnesium  and calcium. - Recheck in about 6 months - -   9. Moderate persistent asthma without complication 01/15/2024  OVASTHMA:  This is  an established problem which is stable (LC MDM) - Your asthma is at goal. - Changes in medications are not needed. - Increasing use of the rescue inhaler could mean worsening of you asthma and you need to call and let us  know if this occurs.  - Routine education was provided, including avoidance of triggers and reporting of daily or worsening symptoms. Get your flu shot every year. - Recheck in about 6 months as needed here and routinely with allergist - -      76M OV30F, HTN/DM/LIPID, Asthma, Obesity, CBG, A1C, UACR, FLP CMP      Immunization History  Administered Date(s) Administered  . Dtap, Unspecified 02/18/1964, 12/13/1965, 01/13/1966, 02/22/1966, 03/13/1970  . Influenza, Injectable, Quadrivalent, Preservative Free 04/17/2018, 04/19/2019, 04/22/2020, 05/21/2021, 05/27/2022  . Influenza, Recombinant, Quadrivalent, Injectable, Preservative Free (Egg Free) 10/16/2017  . Influenza, split virus, trivalent, preservative 06/10/2015  . Influenza,split virus, trivalent, PF 06/15/2023  . Janssen Sars-CoV-2 Vaccination 10/22/2019  . Measles  03/18/1966  . Moderna SARS-CoV-2 Primary Series 12+ yrs 04/30/2020  . Pneumococcal Polysaccharide Vaccine, 23 Valent (PNEUMOVAX-23) 2Y+ 04/22/2020  . Polio, Unspecified 12/14/1963, 02/18/1964, 02/22/1966, 03/13/1970  . Smallpox 03/13/1970  . TD (Adult), 2 Lf Tetanus Toxoid, Preservative Free 02/21/1995  . TDAP VACCINE (BOOSTRIX,ADACEL) 7Y+ 11/26/2012, 09/13/2016  . Td (Adult), Unspecified 07/05/1984  . Varicella Zoster [facility]) 18Y+ 07/27/2022, 12/22/2022     - reviewed with patient  Health Maintenance Status      Date Due Completion Dates   Diabetes: Foot Exam 05/28/2023 05/27/2022 (Done)   Comment on 05/27/2022: See [facility]   Breast Cancer Screening (Mammogram)  06/01/2023 05/31/2022, 03/30/2021   Diabetes:  eGFR for Kidney Evaluation 12/13/2023 12/13/2022, 05/21/2021   Depression Monitoring 12/13/2023 06/15/2023   Adult RSV (60+ Years or Pregnancy) (1 - Risk 60-74 years 1-dose series) 06/14/2024 (Originally 06/11/2023) ---   Pneumococcal Vaccine for Ages 50+ (2 of 2 - PCV) 01/14/2029 (Originally 04/22/2021) 04/22/2020   Diabetes:  Quantitative uACR for Kidney Evaluation 06/14/2024 06/15/2023, 06/15/2023   Diabetes: Retinopathy Screening Combo 12/19/2024 12/20/2022, 12/20/2022   Comprehensive Annual Visit 01/14/2025 01/15/2024, 12/13/2022   Medicare Annual Wellness (AWV) Subsequent Visits 01/14/2025 01/15/2024, 12/13/2022   Diabetes: Hemoglobin A1C 01/14/2025 01/15/2024, 01/15/2024   DTaP/Tdap/Td Vaccines (7 - Td or Tdap) 09/13/2026 09/13/2016, 11/26/2012   Cervical Cancer Screening 06/14/2028 ---   Colorectal Cancer Screening 11/26/2031 11/25/2021, 11/25/2021        - reviewed with patient - other wellness providers: see's EYE doctor for regular examinations, see's DENTIST for regular tooth and gum care  Relevant Interim History 06/15/2023  1. Benign hypertension 06/15/2023 OVHTN:  This is an established problem which is improving (LC MDM) - Your blood pressure is too low and therefore is not at your goal of JNC-8 HTN goals: for adults all ages over 18 years with diabetes: under 140 systolic and under 90 diastolic, optimal at 130/80. - Routine disease specific education is provided and medication use is reviewed and discussed. - A change in medication is needed: Stop Valsartan, Stop Potassium - Continue to work on diet, exercise and healthy weight maintenance. - Monitor your blood pressure and send an update if not staying near your goal. - Monitor your blood pressure and send a list if the top number is staying under 120 or over 140, or if the bottom number is staying over 90  - Monitor your heart rate and send a list if staying under 60 or over 100 - Send a  blood pressure and heart rate list in about 6 months - -    2. Type 2 diabetes mellitus without complication, without long-term current use of insulin (HCC) 06/15/2023 OVDM:  This is an established problem which is stable (LC MDM). - Your type 2 diabetes is about at your goal of A1C under 6.5% - Today your fasting blood glucose is 96 mg/dl, your J8R test is 4.1%. Your A1C test is about the same, with your last A1C 6.0% - Medication use is reviewed and discussed. - A change in medication is not needed at this time. . - Diabetes education was provided regarding medication, low and high blood glucose and response plan, diet, weight maintenance, vision care, and foot care. - Referral to a nutritionist and disease educator was discussed but has already occurred and will not be undertaken again at this time; the patient will review prior education. - Monitor your pre-meal glucose and send a list if staying under 100 or over 200.  - Return  for an evaluation and A1C test in about 6 months - -    3. Mixed hyperlipidemia 06/15/2023  OVLIPID: The lipid report is pending and you will be contacted when it has been evaluated. - Routine, medication, diet, exercise, and optimal weight maintenance education was provided. - Recheck in about 6 months - -    4. Morbid obesity due to excess calories (HCC) 06/15/2023  OVOBESITY:  This is an established problem which is stable (LC MDM)  - Your weight is not at your optimal goal. - A weight higher than optimal leads to, and worsens, certain illnesses like diabetes, high blood pressure, high cholesterol, arthritis and sleep apnea related illnesses. - Routine education is provided. - You need to limit your daily calories. A smart phone app like my fitness pal can help with this process. - Referral to a nutritionist and disease educator was recommended but is deferred at this time by the patient.  - Referral to a weight loss program was recommended but is  deferred at this time by the patient. - Ongoing exercise is needed, this should be done 5 days a week. Your selected exercise is: as tolerated - Behavior modifications are discussed, including parking and walking, using stairs instead of elevators, taking longer to eat, etc. - Recheck in about 6 months - -     9M OV30F: , AMV   WELL/scBR/scCOLON/scCX;, HTN/DM/LIPID, Obesity, A1C, CBG, UACR, FLP     History was provided by: patient, child  Chief Complaint   Chief Complaint  Patient presents with  . Annual Exam  . Hypertension  . Diabetes  . Hyperlipidemia  . Obesity   Current appointment note:  - 9M OV30F: , AMV WELL/scBR/scCOLON/scCX;, HTN/DM/LIPID, Obesity, A1C, CBG, UACR, FLP  PT AWARE/FLP   HPI   Current health status - overall well, chronic diseases stable: yes - chronic problems needing attention today: yes - new/acute problems needing attention today: no - -  T2DM 01/15/2024  - no issues, just ozempic - CBG readings: only fasting, lowest 86 - highest 130 - Symptoms: high/low glucose: none - Symptoms neuropathy: none - Symptoms vision: none. Last eye visit 2024, known abnormal findings: NA. Wt Readings from Last 3 Encounters:  01/15/24 85.7 kg (189 lb)  06/15/23 88 kg (194 lb)  03/07/23 91.7 kg (202 lb 3.2 oz)   BMI Readings from Last 3 Encounters:  01/15/24 32.44 kg/m  06/15/23 33.30 kg/m  03/07/23 34.71 kg/m   - Healthy lifestyle compliance: good with, or working towards: diet, including reduction of saturated fat and avoidance of trans fats;  exercise, at least 30 minutes of moderate intensity activities most days of the week;  discussed. - Medications: medication list: reviewed; compliance: good; adverse effects: none. A1C trend Lab Results  Component Value Date   POCA1C 5.8 (A) 01/15/2024   POCA1C 5.8 (A) 06/15/2023   POCA1C 6.0 (A) 12/13/2022   HGBA1C 6.1 (A) 05/27/2022   HGBA1C 6.0 (A) 11/24/2021   HGBA1C 6.0 05/21/2021   HGBA1C 6.0  10/20/2020   HGBA1C 5.9 04/22/2020     - -  HTN 01/15/2024  - no issues, no meds, is on long term potassium no diuretic, check today - Managed CARDS:  no - Activity level:  Light-to-moderate (3-4 METS): shower, stairsteps, heavy housework, light yardwork, light repairwork like painting with up to 20 lbs, non-exertional recreational activities like golf with cart - Cardiovascular ROS - denies: fatigue, swelling, dyspnea, chest pain, cold feet, claudication, palpitations, fast heart beats, and fainting. *  sometimes end of day swelling ankles - Neurological ROS - Denies: headache, vertigo/dizziness, balance problems, vision loss, diplopia, sudden hearing loss, tinnitus, taste loss/change, swallowing problems, sensation loss or change, muscle weakness, paralysis, extremity tremor/rigidity/abnormal movements, mental status changes, disorientation, and memory loss. -  Weight trend:  stable Wt Readings from Last 3 Encounters:  01/15/24 85.7 kg (189 lb)  06/15/23 88 kg (194 lb)  03/07/23 91.7 kg (202 lb 3.2 oz)    Home BP readings:  125/80 BP Readings from Last 3 Encounters:  01/15/24 124/82  06/15/23 106/64  03/07/23 116/76    - Home HR readings:  80 Pulse Readings from Last 3 Encounters:  01/15/24 90  06/15/23 70  03/07/23 74    - - Symptoms: high/low BP/HR: none - Healthy lifestyle compliance: already noted in another HPI for this visit - Medications: medication list: reviewed; compliance: good; adverse effects: none. - -  LIPIDS 01/15/2024  - no issues - Medications: medication list: reviewed; compliance: good; adverse effects: none. - Healthy lifestyle compliance: already noted in another HPI for this visit - -   OBESITY RECHECK 01/15/2024  - making progress - nutrition evaluation: no - counting calories: yes - losing weight: yes - current medications: ozempic - desired next step: none - -   GERD 01/15/2024 - no issues - Currently on a PPI: pepcid,  nexium  - Currently on H2B: no - Has tried off PPI: no - Medications: medication list: reviewed; compliance: good; adverse effects: none. - - ASTHMA 01/15/2024  - rare - Managed by pulmonary/allergist: yes, Dr. Tiana - Asthma symptoms: Cough , Wheeze, and Dyspnea - Triggers: allergies or cold - Environmental control: yes - Controller medication: yes - Rescue inhaler: yes - Smoking:  no - Vaccinations: discussed - Medications: medication list: reviewed; compliance: good; adverse effects: none. - -   REVIEW OF SYSTEMS   GENERAL: Reports overall feeling well. Denies anorexia, weight loss, fevers/chills/sweats and other general symptoms. HEAD: Denies pain, tenderness, swelling, rashes, and other scalp problems. EYES: Denies visual changes, loss of vision, scotomata, irritation, mattering, lid changes, and pain ENT: Denies hearing difficulties, ear pain, ear canal blockage/drainage/pain, sinus pain, nose congestion/drainage/pain, throat post nasal drainage/pain/exudation, tonsil pain/exudation/mass, tongue pain/change in texture/mass, tooth pain and gum pain/bleeding. RESPIRATORY: Denies chest pain with breathing, chest congestion/fullness, cough, wheeze and shortness of breath. * rare cough/wheeze, dismissed by allergy to resume with rheum HEART: Denies general fatigue, exercise intolerance, chest pain/pressure,dyspnea on exertion/at night/lying flat, heart beats fast/extra/skipping/missing, fainting, weak spells and swelling. BREAST: Performs BSE: yes Change in size or shape: no Skin dimpling, redness, other changes: no Nipple structure change, discharge: no Breast pain: no GI: Denies swallowing pain/difficulty/obstruction, reflux, bloating, nausea/vomiting, dyspepsia, abdominal pain, change in bowel habits, loose stools, constipation, blood in the stool and black tarry bowel movements,  rectal pain/mass. NEURO: Denies headache, vertigo/dizziness/balance loss or change, vision  loss/diplopia, hearing loss/tinnitus, taste loss/change, swallowing problems, muscle weakness/paralysis, extremity tremor/rigidity/abnormal movements, mental status changes/disorientation/memory loss/hallucination. * no residua PSYCH: Denies dysphoria, anhedonia, anxiety, insomnia, irritability, agitation,  pressured speech, racing thoughts, delusions and hallucinations. ENDOCRINE: Denies hyperphagia, polyuria/polydipsia, heat intolerance/sweating/weight loss, cold intolerance/weight gain. SKIN: Denies rash/sores/pustules/nodules, easy bruising, change in moles/other skin spots.  * daily hives LYMPH: Denies lymph node pain and swelling.  MS: Denies joint heat/pain/redness/swelling and spinal pain/stiffness/limitation of motion or function.  * (B) MCP joint swelling an stiffness URINARY: Denies dysuria, urgency, frequency, slow stream, hematuria, discharge, incontinence, change in urinary habits, mass and inguinal herniation. GENITAL/REPRODUCTIVE FEMALE:  LMP remote.  She denies pelvic pain or fullness, abnormal vaginal bleeding, vaginal discharge, vaginal pain, external lesions/ulcers/rash.   REPRODUCTIVE:   PHYSICAL EXAMINATION    Vitals:   01/15/24 1001  BP: 124/82  BP Location: Left arm  Pulse: 90  SpO2: 96%  Weight: 85.7 kg (189 lb)  Height: 1.626 m (5' 4)   Body mass index is 32.44 kg/m.   GENERAL: Awake, alert, oriented, cooperative and appropriately interactive and not in acute distress ; well-nourished, well developed and without the appearance of acute or chronic disease; appears stated age of 63 y.o. * Obese, discussed status, impact and basic approaches.  HEAD: Atraumatic. Nontender. Scalp without nodularity or inflammatory changes. No alopecia. NECK: Supple. Trachea midline, no deviation. No palpable adenopathy. Thyroid  normal position and size without tenderness or nodularity. No hoarseness, stridor or LT tenderness. EYES: No exopthalmos. No lid lag. EOMI, gaze conjugate,  no nystagmus.  PERRLA. No lid inflammation, nodules. No conjunctival dryness, thickening, erythema or discharge. * cataracts are visible OU, discussed  EARS: The pinnae are well-formed and without inflammatory changes. Hearing is grossly intact. The EAC's are without obstruction or inflammation. The TM's appear normal, with normal landmarks and light reflex and without inflammation or scarring and without findings of middle ear fluid. There is no visible cholesteoma.  NOSE: There is no significant nasal discharge or obstruction. The septum is straight. The sinuses are non-tender.  OP: The teeth are intact and there are no caries evident or dentures are in place. There is no gum inflammation evident. The tongue is without structural changes or inflammation and protrudes in the midline.  The throat is without inflammation, cobblestoning, or post nasal drainage. There is no tonsillar enlargement, asymmetry or inflammation. The salivary glands and appendices are not enlarged or tender. BREAST: Breasts: breasts appear normal, no suspicious masses, no skin or nipple changes or axillary nodes, left scar and nodularity RESP: There is no respiratory distress. There in no cyanosis or clubbing.  Auscultation reveals good exchange and equal breath sounds throughout with no rales, rhonchi, expiratory wheeze, or other adventitious sound. HEART: There is no edema. The precordium is quiet. The rhythm is regular and the rate is between 60 and 100. There is no audible murmur, gallop, rub or other extraneous sound. S1 is normal and S2 is physiologically split. S3 and S4 are not heard. VASC: Bilateral lower extremities are without significant varicosities, sequelae of chronic venous stasis disease, acute DVT change, or stasis edema. Peripheral pulses are 2/4 and there is good distal capillary perfusion. Central pulses are 2/4 and are without bruits. GI/ABD: The patient is anicteric and there is no pallor. The abdomen is soft,  nondistended, nontender and without palpable mass or HSM. Bowel sounds are normal.  There is no umbilical hernia or inflammation. There is no cutanous vascular engorgement. PSYCH: The patient is AAO and appropriately interactive. There is no thought disorder or hallucination evident. Speech is fluent and germane. Mood and affect are normal and congruent. NEURO:  AAO and appropriately interactive. Hearing is grossly intact. Face is symmetric, EOMI, PERRL.  Gait is stable. Strength and touch sensation are grossly intact without overt localized or lateralized finding. Tremor is not present. MS: Gait normal and stable. No use of cane or walker.  Joints FROM, stable and without structural or inflammatory changes. Spine is straight with good posture, is nontender and has good mobility.  Muscles have normal tone and no atrophy, spasm or nodularity. DERM: Anicteric. No pallor. Exposed skin does not exhibit abnormal  bruising, petechiae, pigmentation changes, dermatographism, hives, rashes, abnormal moles or other lesions. There is no significant actinic change.  Hair distribution is normal. LYMPHATIC: No palpable adenopathy neck, supraclavicular, axillae, groins, elbows/upper arms. emorrhoids are absent or not clinically significant. Sphincter tone is normal. The anal canal is without lesion, mass or tenderness. The rectum is without mass or blood.  - - Diabetic foot examination - The feet exhibit normal structure and mobility. The skin is without abnormal callous, ulceration, or rash. Circulation is good, feet are warm with good distal capillary refill, and DP/PT pulses are palpable and equal. Sensation it intact to touch and MFT.  Foot care education is provided. - -   TESTING   Results for orders placed or performed in visit on 01/15/24  POC Glucose (Hemocue/NOVA)  Result Value Ref Range   Glucose, POC 77 70 - 125 mg/dL   Kit/Device Lot # [id number]    Kit/Device Expiration Date 03/19/2025   POC  Hemoglobin A1c  Result Value Ref Range   Hemoglobin A1c (POC) 5.8 (A) 4.2 - 5.6 %   A1C Information      A1C Diagnostic Criteria: Prediabetes:5.7% - 6.4% Diabetes:>=6.5% A1C Glycemic Goals: Older Adults: <7.0% Children and Adolescents: <7.0% Pregnant Women: <6.0%   Kit/Device Lot # [id number]    Kit/Device Expiration Date 09/19/2025      The following portions of the patient's history were reviewed and updated as appropriate: allergies, current mediations, past medial history, past surgical history, past family history, past social history, and problem list.   ALLERGIES   Buprenorphine, Egg derived, Tea tree, Azithromycin, Celecoxib, Egg, Hydrocodone-acetaminophen , Iodinated contrast media, Lisinopril, Metrizamide, Morphine , Nutritional supplements, Paroxetine hcl, Shellfish containing products, Strawberry, Tramadol, Delsym, Hydrocodone-acetaminophen , Robitussin, and Tramadol  MEDICATIONS   Medications Ordered Prior to Encounter[1]  HISTORIES   Patient Active Problem List   Diagnosis Date Noted  . Viral upper respiratory tract infection 03/07/2023  . Acute non-recurrent sinusitis 01/13/2023  . Urinary tract infection without hematuria 08/23/2022  . COVID-19 virus infection 04/26/2022  . Keloid of skin 03/30/2021  . Wellness examination 09/30/2020  . Screening for colon cancer 09/30/2020  . Screening for cervical cancer, discontinued 04/22/2020  . Paresthesia of right leg 01/23/2020  . Status post bilateral breast reduction 12/11/2019  . Family history of premature coronary artery disease 10/16/2018  . Cervical spondylosis with radiculopathy 10/04/2018  . Neck pain 09/04/2018  . Dog bite of arm, left, sequela 08/07/2018  . Primary osteoarthritis of both knees 07/09/2018  . Ganglion cyst of dorsum of left wrist 10/16/2017  . Hypokalemia 10/10/2017  . Carpal tunnel syndrome of left wrist 08/14/2017  . Carpal tunnel syndrome of right wrist 08/14/2017  . Ulnar neuropathy of left  upper extremity 08/14/2017  . Other dysphagia 05/26/2017  . Chronic pain syndrome 05/04/2017  . Glenohumeral arthritis, right 05/04/2017  . Rotator cuff impingement syndrome of right shoulder 12/05/2016  . Breast mass 10/06/2016  . Breast cancer screening (GYN) 09/13/2016  . Osteoporosis screening 09/13/2016  . Sebaceous cyst 09/13/2016  . Not currently working due to disabled status 08/24/2016  . SVT (supraventricular tachycardia) 08/24/2016  . Sickle cell trait (HCC) 08/24/2016  . Mitral regurgitation 08/24/2016  . Allergic rhinitis 06/08/2016  . Multigravida 06/08/2016  . Recurrent major depressive disorder, in remission (HCC) 12/07/2015  . GERD (gastroesophageal reflux disease) 12/07/2015  . Menopausal disorder 12/07/2015  . Gout 12/07/2015  . Benign hypertension 10/04/2015  . Type 2 diabetes mellitus without complication, without long-term current use of insulin (HCC)  10/04/2015  . Mixed hyperlipidemia 10/04/2015  . Moderate persistent asthma without complication 10/04/2015  . Morbid obesity due to excess calories (HCC) 10/04/2015  . Restless leg syndrome 10/04/2015  . Lumbar radiculopathy 07/17/2015  . Osteoarthritis of knee 04/30/2013  . Insomnia 03/01/2013  . Acquired genu valgum 12/29/2012  . Common migraine with intractable migraine 10/15/2010  . Vascular dementia with depressed mood (HCC) 10/15/2010    Medical History[2]  Surgical History[3]  Social History[4]  Family History[5]  ENCOUNTER PROVIDER  This document serves as a record of services personally performed by Dr Lamar LITTIE Ofilia Mickey MD.  It was created on their behalf by Joen Macario Reeve, CMA, a trained medical scribe, and Certified Medical Assistant (CMA). During the course of documenting the history, physical exam and medical decision making, I was functioning as a Stage manager. The creation of this record is the provider's dictation and/or activities during the visit.  Electronically signed by Joen Macario Reeve, CMA 01/01/2024 11:45 AM  This document serves as a record of services personally performed by Dr Lamar LITTIE Ofilia Mickey MD.  It was created on their behalf by Corean Krone, CMA, a trained medical scribe, and Certified Medical Assistant (CMA). During the course of documenting the history, physical exam and medical decision making, I was functioning as a Stage manager. The creation of this record is the provider's dictation and/or activities during the visit.  Electronically signed by: Corean Krone, CMA 01/15/2024 9:45 AM     Lamar LITTIE Ofilia, MD  I agree the documentation is accurate and complete.  Electronically signed by: Lamar LITTIE Ofilia, MD 01/15/2024 10:35 AM          [1] Current Outpatient Medications on File Prior to Visit  Medication Sig Dispense Refill  . potassium chloride (MICRO-K) 10 mEq CR capsule Take 10 mEq by mouth daily. potassium    . Accu-Chek Guide test strips test strip USE TO TEST BLOOD SUGAR DAILY 100 strip 3  . Accu-Chek Softclix Lancets misc USE TO TEST BLOOD SUGAR DAILY 100 each 3  . albuterol  HFA (PROVENTIL  HFA;VENTOLIN  HFA;PROAIR  HFA) 90 mcg/actuation inhaler Inhale 2 puffs into the lungs every 4 hours. Asthma 8.5 g 0  . Allergy Relief, loratadine , 10 mg tablet TAKE ONE TABLET BY MOUTH DAILY FOR ALLERGIES 90 tablet 3  . aspirin 81 mg EC tablet Take 81 mg by mouth Once Daily.    SABRA atorvastatin (LIPITOR) 80 mg tablet Take 1 tablet (80 mg total) by mouth daily. for cholesterol 90 tablet 3  . blood-glucose meter (Accu-Chek Aviva Plus Meter) misc USE AS DIRECTED 1 each 0  . EPINEPHrine  (EPIPEN ) 0.3 mg/0.3 mL injection syringe Inject 0.3 mg into the muscle once as needed for anaphylaxis.    . esomeprazole  (NexIUM ) 40 mg DR capsule Take 1 capsule (40 mg total) by mouth daily. reflux 90 capsule 3  . ezetimibe (ZETIA) 10 mg tablet TAKE ONE TABLET BY MOUTH DAILY CHOLESTEROL 90 tablet 3  . famotidine (PEPCID) 40 mg tablet Take 1 tablet by mouth nightly. reflux  90 tablet 3  . fluticasone propionate (FLONASE) 50 mcg/spray nasal spray Frequency:   Dosage:0.0     Instructions:  Note: 1 Inhaler 5  . glucose blood (Accu-Chek Aviva Plus test strp) test strip Accu-Chek Aviva Plus test strips    . ibuprofen (MOTRIN) 800 mg tablet Take 800 mg by mouth every 12 (twelve) hours. 30 tablet 0  . montelukast  (SINGULAIR ) 10 mg tablet Take 1 tablet by mouth nightly for  asthma 90 tablet 3  . naloxone (NARCAN) 4 mg/actuation spry nasal spray Narcan 4 mg/actuation nasal spray    . oxyCODONE  (ROXICODONE ) 5 mg immediate release tablet TAKE 1/2 TO 1 TABLETS 3 TIMES DAILY IF NEEDED, MAX DAILY DOSE 3 TABLETS    . semaglutide (Ozempic) 2 mg/dose (8 mg/3 mL) subcutaneous pen injector Inject 0.75 mLs (2 mg total) into the skin every 7 days. 9 mL 3   No current facility-administered medications on file prior to visit.  [2] Past Medical History: Diagnosis Date  . Asthma (CMD)   . Diabetes mellitus    (CMD)   . Hypertension   . Hypertension with goal to be determined   . Hypolipidemia   [3] Past Surgical History: Procedure Laterality Date  . ABDOMINAL HYSTERECTOMY N/A 2009   Procedure: ABDOMINAL HYSTERECTOMY; fibroids, not for cancer  . BACK SURGERY     Procedure: BACK SURGERY  . CESAREAN SECTION, UNSPECIFIED     Procedure: CESAREAN SECTION  . EXPLORATORY LAPAROTOMY  1976   Procedure: EXPLORATORY LAPAROTOMY; blunt trauma  . KNEE SURGERY Bilateral    Procedure: KNEE SURGERY; x3  . LUMBAR FUSION  2017   Procedure: LUMBAR FUSION  . OTHER SURGICAL HISTORY Bilateral 2008   Procedure: OTHER SURGICAL HISTORY (knee orthoscopy)  . REDUCTION MAMMAPLASTY Bilateral 2021   Procedure: REDUCTION MAMMAPLASTY  . RETROPUBIC SLING  2012   Procedure: RETROPUBIC SLING  . TUBAL LIGATION  2001   Procedure: TUBAL LIGATION  [4] Social History Tobacco Use  . Smoking status: Never  . Smokeless tobacco: Never  Substance Use Topics  . Alcohol use: No  . Drug use: No  [5] Family  History Problem Relation Name Age of Onset  . Stroke Mother  59  . Coronary artery disease Father  86  . Transient ischemic attack Father    . Multiple endocrine neoplasia Neg Hx    . Thyroid  cancer Neg Hx    . Colon cancer Neg Hx    . Breast cancer Neg Hx    . Other Neg Hx         [facility] [place] [facility] Type:: Initial Annual Wellness Visit  Name: Christianna Belmonte Date of Birth: 1960-12-31 Age: 63 y.o. MRN: [id number] Visit Date: 01/15/2024  History obtained from: patient, caregiver  Living Arrangements/Support System/Health Assessment/Pain/Stress Marital status: divorced Number of children: 5 Occupation: disabled Living arrangements: lives with spouse/significant other, lives with family Does the patient have a support system (family, friend, church, Conservation officer, nature, etc)?: Yes   Do you have any dental concerns?: No In the past month, have you experienced a change in your bladder control?: No   Do you have any difficulty obtaining your medications?: No   Do you have trouble consistently taking or remembering to take all of your medications as prescribed?: No Patient rates overall stress level as: None Does stress affect daily life?: No Typical amount of pain: none Does pain affect daily life?: No Are you currently prescribed opioids?: No                Depression Screening      Social History (Tobacco/Drugs/Sexual Activity) Alyson reports that she has never smoked. She has never used smokeless tobacco. Tobacco Use?: No How many times in the past year have you used a recreational drug or used a prescription medication for nonmedical reasons?: None Risk factors for sexually transmitted infections (i.e., multiple sexual partners): No Are  you bothered by sexual problems?: No  Alcohol Screening How often do you have a drink containing alcohol?: Never How many standard drinks  containing alcohol do you have on a typical day?: Never, 1 or 2 drinks How often do you have six or more drinks on one occasion?: Never Audit-C Score: 0  Physical Activity Regular exercise?: Yes Exercise frequency (times per week): 2 Exercise intensity: light (like slow walking)  Diet How many meals a day?: 2 Eats fruit and vegetables daily?: Yes Most meals are obtained by: preparing their own meals  Home and Transportation Safety All rugs have non-skid backing?: N/A, no rugs All stairs or steps have railings?: N/A, no stairs Grab bars in the bathtub or shower?: Yes Have non-skid surface in bathtub or shower?: Yes Good home lighting?: Yes Regular seat belt use?: Yes      Activities of Daily Living Feed self?: Yes Bathe self?: Yes Dress self?: Yes Use toilet without assistance?: Yes Walk without assistance?: Yes    Instrumental Activities of Daily Living Manage finances?: Yes Shop for themselves?: Yes Prepare meals?: Yes Use the telephone?: Yes Manage medications?: Yes   Performs basic housework/laundry?: Yes Drives?: Yes Primary transportation is: driving  Hearing Concerns about hearing?: No Uses hearing aids?: No Hear whispered voice? (Observed): Yes  Vision Concerns about vision?: No Vision exam performed?: (!) No  Fall Risk Is the patient ambulatory?: Yes One or more falls in the last year:: No Feels unsteady when walking:: No  Cognitive Assessment Has a diagnosis of dementia or cognitive impairment?: No Are there any memory concerns by the patient, others, or providers?: No              Advance Directives Living will?: (!) No Advance directive information provided to patient: Yes (discussed and provided) Healthcare POA?: (!) No       Who is your in case of emergency contact?: Stephanie Relationship to patient: Adult child Emergency contact's phone number: [phone]   Other History I reviewed and updated the following risk factors  and conditions as appropriate: Reviewed/Updated: Problem List, Medical History, Surgical History, Family History, Medications, Allergies Reviewed/Updated: Vital Signs (height, weight, and BP), Immunizations, Health Maintenance Patient Care Team Updated: Done  Vital Signs BP 124/82 (BP Location: Left arm)   Pulse 90   Ht 1.626 m (5' 4)   Wt 85.7 kg (189 lb)   SpO2 96%   BMI 32.44 kg/m   Screening and Immunizations Health Maintenance Status      Date Due Completion Dates   Diabetes: Foot Exam 05/28/2023 05/27/2022 (Done)   Comment on 05/27/2022: See [facility]   Breast Cancer Screening (Mammogram) 06/01/2023 05/31/2022, 03/30/2021   Diabetes:  eGFR for Kidney Evaluation 12/13/2023 12/13/2022, 05/21/2021   Depression Monitoring 12/13/2023 06/15/2023   Adult RSV (60+ Years or Pregnancy) (1 - Risk 60-74 years 1-dose series) 06/14/2024 (Originally 06/11/2023) ---   Pneumococcal Vaccine for Ages 50+ (2 of 2 - PCV) 01/14/2029 (Originally 04/22/2021) 04/22/2020   Diabetes:  Quantitative uACR for Kidney Evaluation 06/14/2024 06/15/2023, 06/15/2023   Diabetes: Retinopathy Screening Combo 12/19/2024 12/20/2022, 12/20/2022   Comprehensive Annual Visit 01/14/2025 01/15/2024, 12/13/2022   Medicare Annual Wellness (AWV) Subsequent Visits 01/14/2025 01/15/2024, 12/13/2022   Diabetes: Hemoglobin A1C 01/14/2025 01/15/2024, 01/15/2024   DTaP/Tdap/Td Vaccines (7 - Td or Tdap) 09/13/2026 09/13/2016, 11/26/2012   Cervical Cancer Screening 06/14/2028 ---   Colorectal Cancer Screening 11/26/2031 11/25/2021, 11/25/2021       Immunization History  Administered Date(s) Administered  . Dtap, Unspecified  02/18/1964, 12/13/1965, 01/13/1966, 02/22/1966, 03/13/1970  . Influenza, Injectable, Quadrivalent, Preservative Free 04/17/2018, 04/19/2019, 04/22/2020, 05/21/2021, 05/27/2022  . Influenza, Recombinant, Quadrivalent, Injectable, Preservative Free (Egg Free) 10/16/2017  . Influenza, split virus, trivalent, preservative  06/10/2015  . Influenza,split virus, trivalent, PF 06/15/2023  . Janssen Sars-CoV-2 Vaccination 10/22/2019  . Measles 03/18/1966  . Moderna SARS-CoV-2 Primary Series 12+ yrs 04/30/2020  . Pneumococcal Polysaccharide Vaccine, 23 Valent (PNEUMOVAX-23) 2Y+ 04/22/2020  . Polio, Unspecified 12/14/1963, 02/18/1964, 02/22/1966, 03/13/1970  . Smallpox 03/13/1970  . TD (Adult), 2 Lf Tetanus Toxoid, Preservative Free 02/21/1995  . TDAP VACCINE (BOOSTRIX,ADACEL) 7Y+ 11/26/2012, 09/13/2016  . Td (Adult), Unspecified 07/05/1984  . Varicella Zoster [facility]) 18Y+ 07/27/2022, 12/22/2022    Assessment/Plan: Initial Annual Wellness Visit: The topics above were reviewed with the patient.  Healthy lifestyle principles reviewed.  Recommendations provided when indicated.  Follow up 1 year for next wellness visit.  Orders Placed This Encounter  Procedures  . MG Breast Screening Tomo Bilat  . Comprehensive Metabolic Panel  . Albumin, Random Urine  . POC Glucose (Hemocue/NOVA)  . POC Hemoglobin A1c    New Medications Ordered This Visit  Medications  . potassium chloride (MICRO-K) 10 mEq CR capsule    Sig: Take 10 mEq by mouth daily. potassium    Patient Care Team: Lamar LITTIE Hobby, MD as PCP - General (Family Medicine) Lamar LITTIE Hobby, MD as PCP - Attributed  Electronically signed by: Lamar LITTIE Hobby, MD 01/15/2024 10:38 AM

## 2024-01-25 ENCOUNTER — Telehealth: Payer: Self-pay | Admitting: Allergy

## 2024-01-25 NOTE — Telephone Encounter (Signed)
 We received a document from [facility] for patient. The document states patient has concerns about a possible allergy to material in her denture. I gave patient a call twice to go ahead and get an appointment scheduled. Patients voicemail was full, therefore I could not leave a message.
